# Patient Record
Sex: Female | Born: 1965 | Race: Black or African American | Hispanic: No | Smoking: Former smoker
Health system: Southern US, Community
[De-identification: ages and names within clinical notes are randomized; demographics above are authoritative.]

## PROBLEM LIST (undated history)

## (undated) DIAGNOSIS — J939 Pneumothorax, unspecified: Secondary | ICD-10-CM

## (undated) DIAGNOSIS — E7800 Pure hypercholesterolemia, unspecified: Secondary | ICD-10-CM

## (undated) DIAGNOSIS — D869 Sarcoidosis, unspecified: Secondary | ICD-10-CM

## (undated) DIAGNOSIS — H02823 Cysts of right eye, unspecified eyelid: Secondary | ICD-10-CM

## (undated) DIAGNOSIS — I639 Cerebral infarction, unspecified: Secondary | ICD-10-CM

## (undated) DIAGNOSIS — I10 Essential (primary) hypertension: Secondary | ICD-10-CM

## (undated) HISTORY — DX: Cerebral infarction, unspecified: I63.9

## (undated) HISTORY — PX: KIDNEY STONE SURGERY: SHX686

## (undated) HISTORY — DX: Cysts of right eye, unspecified eyelid: H02.823

## (undated) HISTORY — PX: THYROID SURGERY: SHX805

---

## 2001-10-06 HISTORY — PX: LUNG SURGERY: SHX703

## 2016-04-23 ENCOUNTER — Encounter (HOSPITAL_COMMUNITY): Payer: Self-pay | Admitting: *Deleted

## 2016-04-23 ENCOUNTER — Inpatient Hospital Stay (HOSPITAL_COMMUNITY)
Admission: EM | Admit: 2016-04-23 | Discharge: 2016-04-25 | DRG: 065 | Disposition: A | Discharge status: Rehabilitation Facility | Payer: Medicare Other | Attending: Internal Medicine | Admitting: Internal Medicine

## 2016-04-23 ENCOUNTER — Emergency Department (HOSPITAL_COMMUNITY): Payer: Medicare Other

## 2016-04-23 ENCOUNTER — Inpatient Hospital Stay (HOSPITAL_COMMUNITY): Payer: Medicare Other

## 2016-04-23 DIAGNOSIS — E669 Obesity, unspecified: Secondary | ICD-10-CM | POA: Diagnosis present

## 2016-04-23 DIAGNOSIS — R112 Nausea with vomiting, unspecified: Secondary | ICD-10-CM | POA: Diagnosis not present

## 2016-04-23 DIAGNOSIS — I69351 Hemiplegia and hemiparesis following cerebral infarction affecting right dominant side: Secondary | ICD-10-CM | POA: Diagnosis present

## 2016-04-23 DIAGNOSIS — I639 Cerebral infarction, unspecified: Principal | ICD-10-CM | POA: Diagnosis present

## 2016-04-23 DIAGNOSIS — D869 Sarcoidosis, unspecified: Secondary | ICD-10-CM | POA: Diagnosis present

## 2016-04-23 DIAGNOSIS — Z888 Allergy status to other drugs, medicaments and biological substances status: Secondary | ICD-10-CM | POA: Diagnosis not present

## 2016-04-23 DIAGNOSIS — G8191 Hemiplegia, unspecified affecting right dominant side: Secondary | ICD-10-CM | POA: Diagnosis present

## 2016-04-23 DIAGNOSIS — E7800 Pure hypercholesterolemia, unspecified: Secondary | ICD-10-CM | POA: Diagnosis present

## 2016-04-23 DIAGNOSIS — R471 Dysarthria and anarthria: Secondary | ICD-10-CM | POA: Diagnosis present

## 2016-04-23 DIAGNOSIS — G629 Polyneuropathy, unspecified: Secondary | ICD-10-CM | POA: Diagnosis present

## 2016-04-23 DIAGNOSIS — Z880 Allergy status to penicillin: Secondary | ICD-10-CM | POA: Diagnosis not present

## 2016-04-23 DIAGNOSIS — E785 Hyperlipidemia, unspecified: Secondary | ICD-10-CM | POA: Diagnosis present

## 2016-04-23 DIAGNOSIS — Z823 Family history of stroke: Secondary | ICD-10-CM | POA: Diagnosis not present

## 2016-04-23 DIAGNOSIS — R29703 NIHSS score 3: Secondary | ICD-10-CM | POA: Diagnosis present

## 2016-04-23 DIAGNOSIS — Z79899 Other long term (current) drug therapy: Secondary | ICD-10-CM | POA: Diagnosis not present

## 2016-04-23 DIAGNOSIS — K5900 Constipation, unspecified: Secondary | ICD-10-CM | POA: Diagnosis present

## 2016-04-23 DIAGNOSIS — I10 Essential (primary) hypertension: Secondary | ICD-10-CM | POA: Diagnosis present

## 2016-04-23 DIAGNOSIS — F17210 Nicotine dependence, cigarettes, uncomplicated: Secondary | ICD-10-CM | POA: Diagnosis present

## 2016-04-23 DIAGNOSIS — F329 Major depressive disorder, single episode, unspecified: Secondary | ICD-10-CM | POA: Diagnosis present

## 2016-04-23 DIAGNOSIS — R531 Weakness: Secondary | ICD-10-CM | POA: Insufficient documentation

## 2016-04-23 DIAGNOSIS — Z6834 Body mass index (BMI) 34.0-34.9, adult: Secondary | ICD-10-CM

## 2016-04-23 DIAGNOSIS — M6289 Other specified disorders of muscle: Secondary | ICD-10-CM | POA: Diagnosis not present

## 2016-04-23 HISTORY — DX: Pure hypercholesterolemia, unspecified: E78.00

## 2016-04-23 HISTORY — DX: Pneumothorax, unspecified: J93.9

## 2016-04-23 HISTORY — DX: Essential (primary) hypertension: I10

## 2016-04-23 HISTORY — DX: Sarcoidosis, unspecified: D86.9

## 2016-04-23 LAB — COMPREHENSIVE METABOLIC PANEL
ALT: 18 U/L (ref 14–54)
ANION GAP: 8 (ref 5–15)
AST: 24 U/L (ref 15–41)
Albumin: 4.1 g/dL (ref 3.5–5.0)
Alkaline Phosphatase: 83 U/L (ref 38–126)
BUN: 10 mg/dL (ref 6–20)
CHLORIDE: 105 mmol/L (ref 101–111)
CO2: 26 mmol/L (ref 22–32)
Calcium: 9.8 mg/dL (ref 8.9–10.3)
Creatinine, Ser: 0.73 mg/dL (ref 0.44–1.00)
GFR calc non Af Amer: >60 mL/min (ref >60)
Glucose, Bld: 73 mg/dL (ref 65–99)
Potassium: 3.8 mmol/L (ref 3.5–5.1)
SODIUM: 139 mmol/L (ref 135–145)
Total Bilirubin: 0.6 mg/dL (ref 0.3–1.2)
Total Protein: 8 g/dL (ref 6.5–8.1)

## 2016-04-23 LAB — CBC
HCT: 42.8 % (ref 36.0–46.0)
Hemoglobin: 14.6 g/dL (ref 12.0–15.0)
MCH: 31.3 pg (ref 26.0–34.0)
MCHC: 34.1 g/dL (ref 30.0–36.0)
MCV: 91.8 fL (ref 78.0–100.0)
PLATELETS: 207 10*3/uL (ref 150–400)
RBC: 4.66 MIL/uL (ref 3.87–5.11)
RDW: 13.3 % (ref 11.5–15.5)
WBC: 7 10*3/uL (ref 4.0–10.5)

## 2016-04-23 LAB — DIFFERENTIAL
BASOS PCT: 0 %
Basophils Absolute: 0 10*3/uL (ref 0.0–0.1)
EOS PCT: 2 %
Eosinophils Absolute: 0.1 10*3/uL (ref 0.0–0.7)
Lymphocytes Relative: 35 %
Lymphs Abs: 2.5 10*3/uL (ref 0.7–4.0)
MONO ABS: 0.7 10*3/uL (ref 0.1–1.0)
Monocytes Relative: 10 %
NEUTROS ABS: 3.7 10*3/uL (ref 1.7–7.7)
Neutrophils Relative %: 53 %

## 2016-04-23 LAB — I-STAT CHEM 8, ED
BUN: 10 mg/dL (ref 6–20)
CALCIUM ION: 1.09 mmol/L — AB (ref 1.13–1.30)
CHLORIDE: 105 mmol/L (ref 101–111)
Creatinine, Ser: 0.7 mg/dL (ref 0.44–1.00)
Glucose, Bld: 69 mg/dL (ref 65–99)
HEMATOCRIT: 45 % (ref 36.0–46.0)
Hemoglobin: 15.3 g/dL — ABNORMAL HIGH (ref 12.0–15.0)
POTASSIUM: 3.8 mmol/L (ref 3.5–5.1)
SODIUM: 140 mmol/L (ref 135–145)
TCO2: 23 mmol/L (ref 0–100)

## 2016-04-23 LAB — PROTIME-INR
INR: 1.09 (ref 0.00–1.49)
Prothrombin Time: 14.3 seconds (ref 11.6–15.2)

## 2016-04-23 LAB — I-STAT TROPONIN, ED: Troponin i, poc: 0 ng/mL (ref 0.00–0.08)

## 2016-04-23 LAB — ETHANOL

## 2016-04-23 LAB — APTT: aPTT: 27 seconds (ref 24–37)

## 2016-04-23 MED ORDER — STROKE: EARLY STAGES OF RECOVERY BOOK
Freq: Once | Status: AC
  Administered 2016-04-23: 22:00:00

## 2016-04-23 MED ORDER — DOXEPIN HCL 10 MG/ML PO CONC
3.0000 mg | Freq: Every day | ORAL | Status: DC
  Administered 2016-04-24: 3 mg via ORAL
  Filled 2016-04-23 (×4): qty 0.3

## 2016-04-23 MED ORDER — HYDROCHLOROTHIAZIDE 25 MG PO TABS
12.5000 mg | ORAL_TABLET | Freq: Every morning | ORAL | Status: DC
  Administered 2016-04-24 – 2016-04-25 (×2): 12.5 mg via ORAL
  Filled 2016-04-23 (×2): qty 1

## 2016-04-23 MED ORDER — ASPIRIN 325 MG PO TABS
325.0000 mg | ORAL_TABLET | Freq: Every day | ORAL | Status: DC
  Administered 2016-04-23 – 2016-04-25 (×3): 325 mg via ORAL
  Filled 2016-04-23 (×3): qty 1

## 2016-04-23 MED ORDER — LORAZEPAM 2 MG/ML IJ SOLN
0.5000 mg | Freq: Once | INTRAMUSCULAR | Status: AC
  Administered 2016-04-24: 0.5 mg via INTRAVENOUS
  Filled 2016-04-23: qty 1

## 2016-04-23 MED ORDER — SODIUM CHLORIDE 0.9 % IV SOLN
INTRAVENOUS | Status: AC
  Administered 2016-04-23: 22:00:00 via INTRAVENOUS

## 2016-04-23 MED ORDER — ASPIRIN 325 MG PO TABS
325.0000 mg | ORAL_TABLET | Freq: Once | ORAL | Status: AC
  Administered 2016-04-23: 325 mg via ORAL
  Filled 2016-04-23: qty 1

## 2016-04-23 MED ORDER — GABAPENTIN 300 MG PO CAPS
300.0000 mg | ORAL_CAPSULE | Freq: Every day | ORAL | Status: DC
  Administered 2016-04-23 – 2016-04-24 (×2): 300 mg via ORAL
  Filled 2016-04-23 (×2): qty 1

## 2016-04-23 MED ORDER — SENNOSIDES-DOCUSATE SODIUM 8.6-50 MG PO TABS: 1.0000 | ORAL_TABLET | Freq: Every evening | ORAL | Status: DC | PRN

## 2016-04-23 MED ORDER — ASPIRIN 300 MG RE SUPP: 300.0000 mg | Freq: Every day | RECTAL | Status: DC

## 2016-04-23 MED ORDER — LORAZEPAM 2 MG/ML IJ SOLN
0.5000 mg | Freq: Once | INTRAMUSCULAR | Status: AC
  Administered 2016-04-23: 0.5 mg via INTRAVENOUS
  Filled 2016-04-23: qty 1

## 2016-04-23 MED ORDER — ATORVASTATIN CALCIUM 40 MG PO TABS
40.0000 mg | ORAL_TABLET | Freq: Every day | ORAL | Status: DC
  Administered 2016-04-23 – 2016-04-25 (×3): 40 mg via ORAL
  Filled 2016-04-23 (×3): qty 1

## 2016-04-23 MED ORDER — DULOXETINE HCL 60 MG PO CPEP
60.0000 mg | ORAL_CAPSULE | Freq: Every day | ORAL | Status: DC
  Administered 2016-04-23 – 2016-04-25 (×3): 60 mg via ORAL
  Filled 2016-04-23 (×3): qty 1

## 2016-04-23 NOTE — Consult Note (Addendum)
Neurology Consultation Reason for Consult: Right sided weakness Referring Physician: Alden Server  CC: right sided weakness  History is obtained from:patient  HPI: Janin Kozlowski is a 50 y.o. female with right sided weakness that was present on awakening. Her daughter noticed that her speech was slurred little bit later and therefore EMS was called. A code stroke was called en route given that the slurred speech was reported as starting within the past 8 hours.  She denies any numbness, vertigo, diplopia, nausea or vomiting, or any other symptoms other than weakness and slurred speech.  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  She is not sure of any symptoms of neurosarcoidosis that she has other than headaches.  LKW: 11pm tpa given?: no, out of window    ROS: A 14 point ROS was performed and is negative except as noted in the HPI.   PMH Sarcoid, with pulmonary and neuro gestations Hypertension Hypercholesterolemia  FHx: Grandmother - cva  Social History: +smoker   Exam: Current vital signs: Filed Vitals:   04/23/16 1700 04/23/16 1703  BP: 160/90   Pulse:    Temp:    Resp:  17    Vital signs in last 24 hours:     Physical Exam  Constitutional: Appears well-developed and well-nourished.  Psych: Affect appropriate to situation Eyes: No scleral injection HENT: No OP obstrucion Head: Normocephalic.  Cardiovascular: Normal rate and regular rhythm.  Respiratory: Effort normal and breath sounds normal to anterior ascultation GI: Soft.  No distension. There is no tenderness.  Skin: WDI  Neuro: Mental Status: Patient is awake, alert, oriented to person, place, month, year, and situation. Patient is able to give a clear and coherent history. No signs of aphasia or neglect She does have dysarthria Cranial Nerves: II: Visual Fields are full. Pupils are equal, round, and reactive to light.   III,IV, VI: EOMI  without ptosis or diploplia.  V: Facial sensation is symmetric to temperature VII: Facial movement is symmetric.  VIII: hearing is intact to voice X: Uvula elevates symmetrically XI: Shoulder shrug is symmetric. XII: tongue is midline without atrophy or fasciculations.  Motor: Tone is normal. Bulk is normal. 5/5 strength was present on the left side, on the right she has mild weakness of both the arm and leg Sensory: Sensation is symmetric to light touch and temperature in the arms and legs. Cerebellar: FNF and HKS are intact on the left, consistent with weakness on the right  I have reviewed labs in epic and the results pertinent to this consultation are: Chem 8-unremarkable  I have reviewed the images obtained: CT head-unremarkable  Impression: 50 year old female with new onset right-sided weakness that was present on awakening. I strongly suspect that she has had a small ischemic infarct and has risk factors including hypertension, hyperlipidemia, smoking. Neurosarcoidosis symptoms typically are more insidious(unless caused by infarction) and I suspect that this is rather more likely to be ischemic in nature.  Recommendations: 1. HgbA1c, fasting lipid panel 2. MRI of the brain without contrast 3. Frequent neuro checks 4. Echocardiogram 5. CT angiogram of the head and neck. 6. Prophylactic therapy-Antiplatelet med: Aspirin - dose  PO or  PR 7. Risk factor modification 8. Telemetry monitoring 9. PT consult, OT consult, Speech consult 10. please page stroke NP  Or  PA  Or MD  M-F from 8am -4 pm starting 7/20 as this patient will be followed by the stroke team at this  point.   You can look them up on [email or web address]      Ritta SlotMcNeill Yadhira Mckneely, MD [facility] [phone]  If 7pm- 7am, please page neurology on call as listed in AMION.

## 2016-04-23 NOTE — ED Notes (Signed)
Pt arrives via EMS from physician office. Pt states she went to bed at 2300 last night. States that she awoke with bilateral leg weakness and right arm weakness. Noticed slurred speech at 1100 this morning. Pt has hx NTG, high cholesterol and neuro sarcoidosis.

## 2016-04-23 NOTE — H&P (Signed)
History and Physical    Sharon Nyhannez Guillet HYQ:[id number] DOB: 05/22/1966 DOA: 04/23/2016  Referring MD/NP/PA: Dr. Marlane MingleJakubovitz  PCP: No primary care provider on file. MD in [place]   Patient coming from: home  Chief Complaint: slurred speech   HPI: Sharon Roberson is a 50 y.o. female with medical history significant for hypertension, neuropathy, dyslipidemia who [facility] with new onset right-sided weakness when she woke up this morning. Patient reports she called her daughter who noticed that her mom has slurred speech and she advised her to go to ED for evaluation. Patient reports no numbness or tingling sensation. No lightheadedness or loss of consciousness. No reports of right sided paralysis. No reports of chest pain or palpitations. No fevers or chills. No nausea or vomiting or abdominal pain.  ED Course: Pt was hemodynamically stable in ED. Stroke code called but cancelled subsequently. Pt outside of the treatment window. Her blood work was essentially unremarkable. CT head showed no acute intracranial findings. MRI pending at this time.  Review of Systems:  Constitutional: Negative for fever, chills, diaphoresis, activity change, appetite change and fatigue.  HENT: Negative for ear pain, nosebleeds, congestion, facial swelling, rhinorrhea, neck pain, neck stiffness and ear discharge.   Eyes: Negative for pain, discharge, redness, itching and visual disturbance.  Respiratory: Negative for cough, choking, chest tightness, shortness of breath, wheezing and stridor.   Cardiovascular: Negative for chest pain, palpitations and leg swelling.  Gastrointestinal: Negative for abdominal distention.  Genitourinary: Negative for dysuria, urgency, frequency, hematuria, flank pain, decreased urine volume, difficulty urinating and dyspareunia.  Musculoskeletal: Negative for back pain, joint swelling, arthralgias and gait problem.  Neurological: Per HPI Hematological: Negative for adenopathy.  Does not bruise/bleed easily.  Psychiatric/Behavioral: Negative for hallucinations, behavioral problems, confusion, dysphoric mood, decreased concentration and agitation.   Past Medical History  Diagnosis Date  . Hypertension   . Hypercholesteremia   . Sarcoidosis (HCC)   . Pneumothorax     History reviewed. No pertinent past surgical history.  Social history:  reports that she has been smoking Cigarettes.  She has been smoking about 0.50 packs per day. She does not have any smokeless tobacco history on file. She reports that she drinks alcohol. She reports that she does not use illicit drugs.  Ambulation independent, without cane or wheelchair at baseline   Allergies  Allergen Reactions  . Penicillins Anaphylaxis    Has patient had a PCN reaction causing immediate rash, facial/tongue/throat swelling, SOB or lightheadedness with hypotension: Yes Has patient had a PCN reaction causing severe rash involving mucus membranes or skin necrosis: No Has patient had a PCN reaction that required hospitalization Yes Has patient had a PCN reaction occurring within the last 10 years: Yes If all of the above answers are "NO", then may proceed with Cephalosporin use.   . Sulfamethoxazole Anaphylaxis    Family history of hypertension in mother.  Prior to Admission medications   Not on File    Physical Exam: Filed Vitals:   04/23/16 1659 04/23/16 1700 04/23/16 1703  BP: 155/89 160/90   Pulse: 67    Temp: 97.8 F (36.6 C)    TempSrc: Oral    Resp: 18  17  Height: 4\' 10"  (1.473 m)    Weight: 75.8 kg (167 lb 1.7 oz)    SpO2: 98%      Constitutional: NAD, calm, comfortable Filed Vitals:   04/23/16 1659 04/23/16 1700 04/23/16 1703  BP: 155/89 160/90   Pulse: 67  Temp: 97.8 F (36.6 C)    TempSrc: Oral    Resp: 18  17  Height:  (1.473 m)    Weight: 75.8 kg (167 lb 1.7 oz)    SpO2: 98%     Eyes: PERRL, lids and conjunctivae normal ENMT: Mucous membranes are moist.  Posterior pharynx clear of any exudate or lesions.Normal dentition.  Neck: normal, supple, no masses, no thyromegaly Respiratory: clear to auscultation bilaterally, no wheezing, no crackles. Normal respiratory effort. No accessory muscle use.  Cardiovascular: Regular rate and rhythm, no murmurs / rubs / gallops. No extremity edema. 2+ pedal pulses. No carotid bruits.  Abdomen: no tenderness, no masses palpated. No hepatosplenomegaly. Bowel sounds positive.  Musculoskeletal: no clubbing / cyanosis. No joint deformity upper and lower extremities. Good ROM, no contractures. Normal muscle tone.  Skin: no rashes, lesions, ulcers. No induration Psychiatric: Normal judgment and insight. Alert and oriented x 3. Normal mood.   Neurologic:  General: Mental Status: Alert, oriented, thought content appropriate. Mild dysarthria without evidence of aphasia. Able to follow 3 step commands without difficulty. Cranial Nerves: II: Discs flat bilaterally; Visual fields grossly normal, pupils equal, round, reactive to light and accommodation III,IV, VI: ptosis not present, extra-ocular motions intact bilaterally V,VII: smile symmetric, facial light touch sensation normal bilaterally VIII: hearing normal bilaterally IX,X: uvula rises symmetrically XI: bilateral shoulder shrug XII: midline tongue extension without atrophy or fasciculations  Motor: Right :Upper extremity 5/5Left: Upper extremity 5/5 Lower extremity 5/5Lower extremity 5/5 Tone and bulk:normal tone throughout; no atrophy noted Sensory: Pinprick and light touch intact throughout, bilaterally Deep Tendon Reflexes:  Right: Upper Extremity Left: Upper extremity   biceps (C-5 to C-6) 2/4 biceps (C-5 to C-6) 2/4  Lower Extremity Lower Extremity  quadriceps (L-2 to L-4) 2/4  quadriceps (L-2 to L-4) 2/4 Achilles (S1) 2/4Achilles (S1) 2/4  Plantars: Right: downgoingLeft: downgoing Cerebellar: normal finger-to-nose, normal heel-to-shin test   Labs on Admission: I have personally reviewed following labs and imaging studies  CBC:  Recent Labs Lab 04/23/16 1638 04/23/16 1650  WBC 7.0  --   NEUTROABS 3.7  --   HGB 14.6 15.3*  HCT 42.8 45.0  MCV 91.8  --   PLT 207  --    Basic Metabolic Panel:  Recent Labs Lab 04/23/16 1638 04/23/16 1650  NA 139 140  K 3.8 3.8  CL 105 105  CO2 26  --   GLUCOSE 73 69  BUN 10 10  CREATININE 0.73 0.70  CALCIUM 9.8  --    GFR: Estimated Creatinine Clearance: 72.9 mL/min (by C-G formula based on Cr of 0.7). Liver Function Tests:  Recent Labs Lab 04/23/16 1638  AST 24  ALT 18  ALKPHOS 83  BILITOT 0.6  PROT 8.0  ALBUMIN 4.1   No results for input(s): LIPASE, AMYLASE in the last 168 hours. No results for input(s): AMMONIA in the last 168 hours. Coagulation Profile:  Recent Labs Lab 04/23/16 1638  INR 1.09   Cardiac Enzymes: No results for input(s): CKTOTAL, CKMB, CKMBINDEX, TROPONINI in the last 168 hours. BNP (last 3 results) No results for input(s): PROBNP in the last 8760 hours. HbA1C: No results for input(s): HGBA1C in the last 72 hours. CBG: No results for input(s): GLUCAP in the last 168 hours. Lipid Profile: No results for input(s): CHOL, HDL, LDLCALC, TRIG, CHOLHDL, LDLDIRECT in the last 72 hours. Thyroid Function Tests: No results for input(s): TSH, T4TOTAL, FREET4, T3FREE, THYROIDAB in the last 72 hours. Anemia Panel: No results  for input(s): VITAMINB12, FOLATE, FERRITIN, TIBC, IRON, RETICCTPCT in the last 72 hours. Urine analysis: No results found for: COLORURINE, APPEARANCEUR, LABSPEC, PHURINE, GLUCOSEU, HGBUR, BILIRUBINUR, KETONESUR, PROTEINUR, UROBILINOGEN, NITRITE, LEUKOCYTESUR Sepsis  Labs: @LABRCNTIP (procalcitonin:4,lacticidven:4) )No results found for this or any previous visit (from the past 240 hour(s)).   Radiological Exams on Admission: Ct Head Wo Contrast 04/23/2016   Normal CT evaluation of the brain. Critical Value/emergent results were called by telephone at the time of interpretation on 04/23/2016 at 4:55 pm to April, working with Dr. Amada Jupiter, who verbally acknowledged these results. Electronically Signed   By: Kennith Center M.D.   On: 04/23/2016 16:56    EKG: Independently reviewed. Sinus rhythm.  Assessment/Plan  Principal Problem:   Acute right-sided weakness / CVA (cerebral infarction), unspecified mechanism  - Stroke work up initiated:  - Aspirin daily - MRI brain / MRA brain - pending  - CT head - no acute intracranial findings  - 2D ECHO - pending  - Carotid doppler - pending  - HgbA1c, Lipid panel - pending. LDL goal < 100. Patient on zocor  80 mg a day - Diet: passed swallow screen - Therapy: PT/OT - eval pending  - Appreciate neurology following  Active Problems:   Benign essential HTN - Resume Hctz    Neuropathy (HCC) - Resume gabapentin    Dyslipidemia - Resume statin therapy       DVT prophylaxis: SCD's bilaterally Code Status: full code Family Communication: no family at the bedside Disposition Plan: admission to telemetry  Consults called: stroke team Admission status: inpatient    Manson Passey MD [facility] Pager [phone]  If 7PM-7AM, please contact night-coverage [email or web address] Password [email or web address]  04/23/2016, 6:20 PM

## 2016-04-23 NOTE — ED Provider Notes (Signed)
CSN: [id number]     Arrival date & time 04/23/16  1633 History   First MD Initiated Contact with Patient 04/23/16 1637     No chief complaint on file. Chief complaint right arm and right leg weakness slurred speech  An emergency department physician performed an initial assessment on this suspected stroke patient at 3. (Consider location/radiation/quality/duration/timing/severity/associated sxs/prior Treatment) HPI Patient awakened at 8:30 AM today with right arm and right leg weakness. She was told by her daughter at 3:30 PM today that her speech was slurred. Patient did not recognize that she had slurred speech. No treatment prior to coming here. She states weakness is improved since onset. She was last normal at 11 PM yesterday, when she went to bed. No other associated symptoms. Nothing makes symptoms better or worse. No other associated symptoms. Weakness improved spontaneously Past Medical History  Diagnosis Date  . Hypertension   . Hypercholesteremia   . Sarcoidosis (HCC)   . Pneumothorax    History reviewed. No pertinent past surgical history. No family history on file. Social History  Substance Use Topics  . Smoking status: Current Every Day Smoker -- 0.50 packs/day    Types: Cigarettes  . Smokeless tobacco: None  . Alcohol Use: Yes     Comment: occasional  Denies drug use OB History    No data available     Review of Systems  HENT: Negative.   Respiratory: Negative.   Cardiovascular: Negative.   Gastrointestinal: Negative.   Musculoskeletal: Negative.   Skin: Negative.   Neurological: Positive for speech difficulty and weakness.  Psychiatric/Behavioral: Negative.   All other systems reviewed and are negative.     Allergies  Penicillins and Sulfamethoxazole  Home Medications   Prior to Admission medications   Not on File   BP 160/90 mmHg  Pulse 67  Temp(Src) 97.8 F (36.6 C) (Oral)  Resp 17  Ht 4\' 10"  (1.473 m)  Wt 167 lb 1.7 oz (75.8 kg)  BMI  34.94 kg/m2  SpO2 98% Physical Exam  Constitutional: She is oriented to person, place, and time. She appears well-developed and well-nourished. No distress.  HENT:  Head: Normocephalic and atraumatic.  No facial asymmetry  Eyes: Conjunctivae are normal. Pupils are equal, round, and reactive to light.  Neck: Neck supple. No tracheal deviation present. No thyromegaly present.  Cardiovascular: Normal rate and regular rhythm.   No murmur heard. Pulmonary/Chest: Effort normal and breath sounds normal.  Abdominal: Soft. Bowel sounds are normal. She exhibits no distension. There is no tenderness.  Musculoskeletal: Normal range of motion. She exhibits no edema or tenderness.  Neurological: She is alert and oriented to person, place, and time. No cranial nerve deficit. Coordination normal.  Motor strength 5 over 5 overall. Finger to nose normal. DTRs symmetric bilaterally at knee jerk ankle jerk and biceps toes downward going bilaterally. Speech is slightly slurred  Skin: Skin is warm and dry. No rash noted.  Psychiatric: She has a normal mood and affect.  Nursing note and vitals reviewed.   ED Course  Procedures (including critical care time) Labs Review Labs Reviewed  I-STAT CHEM 8, ED - Abnormal; Notable for the following:    Calcium, Ion 1.09 (*)    Hemoglobin 15.3 (*)    All other components within normal limits  PROTIME-INR  APTT  CBC  DIFFERENTIAL  ETHANOL  COMPREHENSIVE METABOLIC PANEL  URINE RAPID DRUG SCREEN, HOSP PERFORMED  URINALYSIS, ROUTINE W REFLEX MICROSCOPIC (NOT AT [facility])  Rosezena Sensor, ED  Imaging Review Ct Head Wo Contrast  04/23/2016  CLINICAL DATA:  Code stroke. EXAM: CT HEAD WITHOUT CONTRAST TECHNIQUE: Contiguous axial images were obtained from the base of the skull through the vertex without intravenous contrast. COMPARISON:  None. FINDINGS: There is no evidence for acute hemorrhage, hydrocephalus, mass lesion, or abnormal extra-axial fluid collection. No  definite CT evidence for acute infarction. The visualized paranasal sinuses and mastoid air cells are clear. IMPRESSION: Normal CT evaluation of the brain. Critical Value/emergent results were called by telephone at the time of interpretation on 04/23/2016 at 4:55 pm to April, working with Dr. Amada Jupiter, who verbally acknowledged these results. Electronically Signed   By: Kennith Center M.D.   On: 04/23/2016 16:56   I have personally reviewed and evaluated these images and lab results as part of my medical decision-making.   EKG Interpretation   Date/Time:  Wednesday April 23 2016 17:05:37 EDT Ventricular Rate:  71 PR Interval:    QRS Duration: 80 QT Interval:  415 QTC Calculation: 451 R Axis:   59 Text Interpretation:  Sinus rhythm No old tracing to compare Confirmed by  Ethelda Chick  MD, Sharada Albornoz [phone]) on 04/23/2016 5:20:51 PM     NIH stroke scale calculated at 3 by neurologist Dr. Amada Jupiter. He noticed slight right-sided weakness and slurred speech. Patient passed stroke swallow screen. Aspirin ordered orally. Results for orders placed or performed during the hospital encounter of 04/23/16  Ethanol  Result Value Ref Range   Alcohol, Ethyl (B) <5 <5 mg/dL  Protime-INR  Result Value Ref Range   Prothrombin Time 14.3 11.6 - 15.2 seconds   INR 1.09 0.00 - 1.49  APTT  Result Value Ref Range   aPTT 27 24 - 37 seconds  CBC  Result Value Ref Range   WBC 7.0 4.0 - 10.5 K/uL   RBC 4.66 3.87 - 5.11 MIL/uL   Hemoglobin 14.6 12.0 - 15.0 g/dL   HCT 19.1 47.8 - 29.5 %   MCV 91.8 78.0 - 100.0 fL   MCH 31.3 26.0 - 34.0 pg   MCHC 34.1 30.0 - 36.0 g/dL   RDW 62.1 30.8 - 65.7 %   Platelets 207 150 - 400 K/uL  Differential  Result Value Ref Range   Neutrophils Relative % 53 %   Neutro Abs 3.7 1.7 - 7.7 K/uL   Lymphocytes Relative 35 %   Lymphs Abs 2.5 0.7 - 4.0 K/uL   Monocytes Relative 10 %   Monocytes Absolute 0.7 0.1 - 1.0 K/uL   Eosinophils Relative 2 %   Eosinophils Absolute 0.1 0.0 -  0.7 K/uL   Basophils Relative 0 %   Basophils Absolute 0.0 0.0 - 0.1 K/uL  Comprehensive metabolic panel  Result Value Ref Range   Sodium 139 135 - 145 mmol/L   Potassium 3.8 3.5 - 5.1 mmol/L   Chloride 105 101 - 111 mmol/L   CO2 26 22 - 32 mmol/L   Glucose, Bld 73 65 - 99 mg/dL   BUN 10 6 - 20 mg/dL   Creatinine, Ser 8.46 0.44 - 1.00 mg/dL   Calcium 9.8 8.9 - 96.2 mg/dL   Total Protein 8.0 6.5 - 8.1 g/dL   Albumin 4.1 3.5 - 5.0 g/dL   AST 24 15 - 41 U/L   ALT 18 14 - 54 U/L   Alkaline Phosphatase 83 38 - 126 U/L   Total Bilirubin 0.6 0.3 - 1.2 mg/dL   GFR calc non Af Amer >60 >60 mL/min   GFR calc  Af Amer >60 >60 mL/min   Anion gap 8 5 - 15  I-Stat Chem 8, ED  ([facility])  Result Value Ref Range   Sodium 140 135 - 145 mmol/L   Potassium 3.8 3.5 - 5.1 mmol/L   Chloride 105 101 - 111 mmol/L   BUN 10 6 - 20 mg/dL   Creatinine, Ser 1.610.70 0.44 - 1.00 mg/dL   Glucose, Bld 69 65 - 99 mg/dL   Calcium, Ion 0.961.09 (L) 1.13 - 1.30 mmol/L   TCO2 23 0 - 100 mmol/L   Hemoglobin 15.3 (H) 12.0 - 15.0 g/dL   HCT 04.545.0 40.936.0 - 81.146.0 %  I-stat troponin, ED ([facility] at [facility])  Result Value Ref Range   Troponin i, poc 0.00 0.00 - 0.08 ng/mL   Comment 3           Ct Head Wo Contrast  04/23/2016  CLINICAL DATA:  Code stroke. EXAM: CT HEAD WITHOUT CONTRAST TECHNIQUE: Contiguous axial images were obtained from the base of the skull through the vertex without intravenous contrast. COMPARISON:  None. FINDINGS: There is no evidence for acute hemorrhage, hydrocephalus, mass lesion, or abnormal extra-axial fluid collection. No definite CT evidence for acute infarction. The visualized paranasal sinuses and mastoid air cells are clear. IMPRESSION: Normal CT evaluation of the brain. Critical Value/emergent results were called by telephone at the time of interpretation on 04/23/2016 at 4:55 pm to April, working with Dr. Amada JupiterKirkpatrick, who verbally acknowledged these results. Electronically Signed   By: Kennith CenterEric   Mansell M.D.   On: 04/23/2016 16:56    MDM  Code stroke was called in the field which was canceled upon her arrival here by me in conjunction with Dr. Amada JupiterKirkpatrick Final diagnoses:  Right sided weakness   Dr. Elisabeth PigeoneVine consulted. Plan admission to telemetry for further stroke workup. Dx #1 acute right arm and right leg weakness #2 dysarthria #3 tobacco abuse #4 elevated blood pressure      Doug SouSam Heela Heishman, MD 04/23/16 1750

## 2016-04-24 ENCOUNTER — Inpatient Hospital Stay (HOSPITAL_COMMUNITY): Payer: Medicare Other

## 2016-04-24 DIAGNOSIS — M6289 Other specified disorders of muscle: Secondary | ICD-10-CM

## 2016-04-24 DIAGNOSIS — E785 Hyperlipidemia, unspecified: Secondary | ICD-10-CM

## 2016-04-24 DIAGNOSIS — I6789 Other cerebrovascular disease: Secondary | ICD-10-CM

## 2016-04-24 DIAGNOSIS — I639 Cerebral infarction, unspecified: Principal | ICD-10-CM

## 2016-04-24 DIAGNOSIS — I10 Essential (primary) hypertension: Secondary | ICD-10-CM

## 2016-04-24 DIAGNOSIS — F17200 Nicotine dependence, unspecified, uncomplicated: Secondary | ICD-10-CM

## 2016-04-24 DIAGNOSIS — G629 Polyneuropathy, unspecified: Secondary | ICD-10-CM

## 2016-04-24 LAB — LIPID PANEL
Cholesterol: 251 mg/dL — ABNORMAL HIGH (ref 0–200)
HDL: 54 mg/dL (ref >40)
LDL CALC: 147 mg/dL — AB (ref 0–99)
Total CHOL/HDL Ratio: 4.6 RATIO
Triglycerides: 250 mg/dL — ABNORMAL HIGH (ref <150)
VLDL: 50 mg/dL — ABNORMAL HIGH (ref 0–40)

## 2016-04-24 LAB — URINALYSIS, ROUTINE W REFLEX MICROSCOPIC
Bilirubin Urine: NEGATIVE
Glucose, UA: NEGATIVE mg/dL
Hgb urine dipstick: NEGATIVE
Ketones, ur: NEGATIVE mg/dL
NITRITE: NEGATIVE
Protein, ur: NEGATIVE mg/dL
SPECIFIC GRAVITY, URINE: 1.016 (ref 1.005–1.030)
pH: 6.5 (ref 5.0–8.0)

## 2016-04-24 LAB — ECHOCARDIOGRAM COMPLETE
HEIGHTINCHES: 58 in
Weight: 2673.74 oz

## 2016-04-24 LAB — URINE MICROSCOPIC-ADD ON: RBC / HPF: NONE SEEN RBC/hpf (ref 0–5)

## 2016-04-24 LAB — RAPID URINE DRUG SCREEN, HOSP PERFORMED
Amphetamines: NOT DETECTED
Barbiturates: NOT DETECTED
Benzodiazepines: NOT DETECTED
Cocaine: NOT DETECTED
Opiates: NOT DETECTED
Tetrahydrocannabinol: POSITIVE — AB

## 2016-04-24 MED ORDER — GADOBENATE DIMEGLUMINE 529 MG/ML IV SOLN
15.0000 mL | Freq: Once | INTRAVENOUS | Status: AC
  Administered 2016-04-24: 15 mL via INTRAVENOUS

## 2016-04-24 MED ORDER — NICOTINE 14 MG/24HR TD PT24
14.0000 mg | MEDICATED_PATCH | Freq: Every day | TRANSDERMAL | Status: DC
  Administered 2016-04-24 – 2016-04-25 (×2): 14 mg via TRANSDERMAL
  Filled 2016-04-24: qty 1

## 2016-04-24 MED ORDER — FAMOTIDINE 20 MG PO TABS
40.0000 mg | ORAL_TABLET | Freq: Every day | ORAL | Status: DC
  Administered 2016-04-24 – 2016-04-25 (×2): 40 mg via ORAL
  Filled 2016-04-24 (×2): qty 2

## 2016-04-24 MED ORDER — ACETAMINOPHEN 325 MG PO TABS
650.0000 mg | ORAL_TABLET | Freq: Four times a day (QID) | ORAL | Status: DC | PRN
  Administered 2016-04-24 – 2016-04-25 (×2): 650 mg via ORAL
  Filled 2016-04-24 (×2): qty 2

## 2016-04-24 NOTE — Progress Notes (Signed)
Pt went down for MRI after 0.5mg  of Ativan given at 2138, but MRI personnel called and said pt is still very apprehensive and yelling, so pt was brought back to room, NP Maren ReamerKaren Kirby paged who wrote an order to give an extra 0.5mg  of Ativan, same related back to pt and MRI staff, said will call when they are ready for her, pt reassured, will however continue to monitor. Obasogie-Asidi, Nika Yazzie Efe

## 2016-04-24 NOTE — Progress Notes (Signed)
STROKE TEAM PROGRESS NOTE   HPI: Sharon Roberson is a 50 y.o. female with right sided weakness that was present on awakening. Her daughter noticed that her speech was slurred little bit later and therefore EMS was called. A code stroke was called en route given that the slurred speech was reported as starting within the past 8 hours.  She denies any numbness, vertigo, diplopia, nausea or vomiting, or any other symptoms other than weakness and slurred speech.  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  She is not sure of any symptoms of neurosarcoidosis that she has other than headaches.  LKW: 11pm tpa given?: no, out of window  SUBJECTIVE (INTERVAL HISTORY) Her daughter is at the bedside.  Overall she feels her condition is stable. She had right UE and LE weakness and dysarthria. Admitted smoking 1 PPD. UDS showed THC positive. Has Hx of HTN and HLD on medications including zocor 80mg .    OBJECTIVE Temp:  [98.3 F (36.8 C)-99.2 F (37.3 C)] 98.8 F (37.1 C) (07/20 1440) Pulse Rate:  [68-89] 74 (07/20 1440) Cardiac Rhythm:  [-] Normal sinus rhythm (07/20 0900) Resp:  [16-20] 20 (07/20 1440) BP: (118-163)/(63-99) 118/65 mmHg (07/20 1440) SpO2:  [98 %-100 %] 100 % (07/20 1440)  No results for input(s): GLUCAP in the last 168 hours.  Recent Labs Lab 04/23/16 1638 04/23/16 1650  NA 139 140  K 3.8 3.8  CL 105 105  CO2 26  --   GLUCOSE 73 69  BUN 10 10  CREATININE 0.73 0.70  CALCIUM 9.8  --     Recent Labs Lab 04/23/16 1638  AST 24  ALT 18  ALKPHOS 83  BILITOT 0.6  PROT 8.0  ALBUMIN 4.1    Recent Labs Lab 04/23/16 1638 04/23/16 1650  WBC 7.0  --   NEUTROABS 3.7  --   HGB 14.6 15.3*  HCT 42.8 45.0  MCV 91.8  --   PLT 207  --    No results for input(s): CKTOTAL, CKMB, CKMBINDEX, TROPONINI in the last 168 hours.  Recent Labs  04/23/16 1638  LABPROT 14.3  INR 1.09    Recent Labs  04/24/16 0423  COLORURINE  YELLOW  LABSPEC 1.016  PHURINE 6.5  GLUCOSEU NEGATIVE  HGBUR NEGATIVE  BILIRUBINUR NEGATIVE  KETONESUR NEGATIVE  PROTEINUR NEGATIVE  NITRITE NEGATIVE  LEUKOCYTESUR TRACE*       Component Value Date/Time   CHOL 251* 04/24/2016 0228   TRIG 250* 04/24/2016 0228   HDL 54 04/24/2016 0228   CHOLHDL 4.6 04/24/2016 0228   VLDL 50* 04/24/2016 0228   LDLCALC 147* 04/24/2016 0228   No results found for: HGBA1C    Component Value Date/Time   LABOPIA NONE DETECTED 04/24/2016 0423   COCAINSCRNUR NONE DETECTED 04/24/2016 0423   LABBENZ NONE DETECTED 04/24/2016 0423   AMPHETMU NONE DETECTED 04/24/2016 0423   THCU POSITIVE* 04/24/2016 0423   LABBARB NONE DETECTED 04/24/2016 0423     Recent Labs Lab 04/23/16 1638  ETH <5    I have personally reviewed the radiological images below and agree with the radiology interpretations.  Ct Head Wo Contrast  04/23/2016  IMPRESSION: Normal CT evaluation of the brain.   Mri and Mra Brain W Wo Contrast  04/24/2016   IMPRESSION: 1. 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 2. Moderately advanced cerebral white matter disease for age, nonspecific. Considerations include chronic small vessel ischemia, neurosarcoidosis, other  vasculitis, and demyelination. No evidence of dural or leptomeningeal sarcoid involvement. 3. No evidence of major branch occlusion or significant proximal stenosis.  Carotid Doppler  1-39% internal carotid artery stenosis bilaterally. Vertebral arteries are patent with antegrade flow.   2D Echocardiogram  - Left ventricle: The cavity size was normal. Wall thickness was  increased in a pattern of mild LVH. Systolic function was normal.  The estimated ejection fraction was in the range of 60% to 65%.  Wall motion was normal; there were no regional wall motion  abnormalities. Doppler parameters are consistent with abnormal  left ventricular relaxation (grade 1 diastolic dysfunction).  Doppler parameters are  consistent with high ventricular filling  pressure. Impressions: - Normal LV systolic function; mild LVH; grade 1 diastolic  dysfunction with elevated LV filling pressure; trace TR.   PHYSICAL EXAM  Temp:  [98.3 F (36.8 C)-99.2 F (37.3 C)] 98.8 F (37.1 C) (07/20 1440) Pulse Rate:  [68-89] 74 (07/20 1440) Resp:  [16-20] 20 (07/20 1440) BP: (118-163)/(63-99) 118/65 mmHg (07/20 1440) SpO2:  [98 %-100 %] 100 % (07/20 1440)  General - Well nourished, well developed, in no apparent distress.  Ophthalmologic - Sharp disc margins OU.  Cardiovascular - Regular rate and rhythm with no murmur.  Neck - supple, no carotid bruits  Mental Status -  Level of arousal and orientation to time, place, and person were intact. Language including expression, naming, repetition, comprehension was assessed and found intact.  Cranial Nerves II - XII - II - Visual field intact OU. III, IV, VI - Extraocular movements intact. V - Facial sensation intact bilaterally. VII - Facial movement intact bilaterally. VIII - Hearing & vestibular intact bilaterally. X - Palate elevates symmetrically, mild dysarthria. XI - Chin turning & shoulder shrug intact bilaterally. XII - Tongue protrusion intact.  Motor Strength - The patient's strength was normal in LUE and LLE, but 4/5 RUE and RLE and pronator drift was present on the right.  Bulk was normal and fasciculations were absent.   Motor Tone - Muscle tone was assessed at the neck and appendages and was normal.  Reflexes - The patient's reflexes were symmetrical in all extremities and she had no pathological reflexes.  Sensory - Light touch, temperature/pinprick were assessed and were symmetrical.    Coordination - The patient had normal movements in the hands with no ataxia or dysmetria.  Tremor was absent.  Gait and Station - deferred to PT in room.   ASSESSMENT/PLAN Ms. Sharon Roberson is a 50 y.o. female with history of HTN and HLD, sarcoidosis and  neurosarcoidosis as well as smoker admitted for right UE and LE weakness. Symptoms stable.    Stroke:  left pontine infarct.  left cingulate gyrus linear DWI changes suspicious for infarct but atypical. Etiology still most likely small vessel disease source given pontine location and multiple stroke risk factors including HTN, HLD, and smoker.  MRI  left pontine infarct.  left cingulate gyrus linear DWI changes suspicious for infarct but atypical.   MRA  unremarkable  Carotid Doppler  unremarkable  2D Echo  EF 60-65%  Due to atypical linear DWI changes at left cingulate gyrus, will recommend 30 day cardiac event monitoring as outpt. Please arrange on discharge.  LDL 147  HgbA1c pending  SCDs for VTE prophylaxis  Diet Heart Room service appropriate?: Yes; Fluid consistency:: Thin   No antithrombotic prior to admission, now on aspirin 325 mg daily  Patient counseled to be compliant with her antithrombotic medications  Ongoing  aggressive stroke risk factor management  Therapy recommendations:  CIR  Disposition:  pending  Hypertension  Home meds:   HCTZ Permissive hypertension (OK if <220/120) for 24-48 hours post stroke and then gradually normalized within 5-7 days. Currently on none BP meds  Stable  Patient counseled to be compliant with her blood pressure medications  Hyperlipidemia  Home meds:  zocor 80   Currently on lipitor 40  LDL 147, goal < 70  Continue statin at discharge  Tobacco abuse  Current smoker  Smoking cessation counseling provided  Nicotine patch provided  Pt is willing to quit  Other Stroke Risk Factors  ETOH use  Obesity, Body mass index is 34.94 kg/(m^2).   Other Active Problems  THC abuse - UDS positive  High TG  Other Pertinent History    Hospital day # 1  Neurology will sign off. Please call with questions. Pt [facility] 2 months. Thanks for the consult.   Marvel PlanJindong Christean Silvestri, MD  PhD Stroke Neurology 04/24/2016 6:27 PM    To contact Stroke Continuity provider, please refer to [email or web address]. After hours, contact General Neurology

## 2016-04-24 NOTE — Progress Notes (Signed)
Patient ID: Sharon Roberson, female   DOB: May 11, 1966, 50 y.o.   MRN: [id number]  PROGRESS NOTE    Sharon Roberson  NWG:[id number] DOB: 07/05/1966 DOA: 04/23/2016  PCP: No primary care provider on file.   Brief Narrative:  50 y.o. female with medical history significant for hypertension, neuropathy, dyslipidemia who presented to [facility] with new onset right-sided weakness when she woke up this morning. Patient reported she called her daughter who noticed that her mom has slurred speech and she advised her to go to ED for evaluation.  Pt was hemodynamically stable in ED. Stroke code called but cancelled subsequently. Pt outside of the treatment window. Her blood work was essentially unremarkable. CT head showed no acute intracranial findings. MRI pending at this time.  Assessment & Plan:   Principal Problem:  Acute right-sided weakness / CVA (cerebral infarction), pons and left cingulate gyrus - Stroke work up initiated:  - Aspirin daily - MRI brain / MRA brain - small acute infarcts in the pons and left cingulate gyrus. Moderately advanced cerebral white matter disease for age, nonspecific. Considerations include chronic small vessel ischemia, neurosarcoidosis, other vasculitis, and demyelination. No evidence of dural or leptomeningeal sarcoid involvement. No evidence of major branch occlusion or significant proximal stenosis.  - CT head - no acute intracranial findings  - 2D ECHO - EF 60%, grade 1 diastolic dysfunction   - Carotid doppler - pending  - HgbA1c, Lipid panel - 147. LDL goal < 100. Patient on zocor 80 mg a day - UDS positive for THC - Diet: passed swallow screen - Therapy: PT/OT - CIR recommended, awaiting their input  - Appreciate neurology following  Active Problems:  Benign essential HTN - Continue Hctz   Neuropathy (HCC) - Continue gabapentin   Dyslipidemia - Continue statin therapy     DVT prophylaxis: SCD's bilaterally  Code Status: full code  Family  Communication: daughter at the bedside this am Disposition Plan: CIR depending on CIR evaluation and recommendations    Consultants:   Neurology  Procedures:   ECHO 04/24/2016 - EF 60%, grade 1 diastolic dysfunction   Antimicrobials:   None    Subjective: No overnight events.  Objective: Filed Vitals:   04/24/16 0700 04/24/16 0900 04/24/16 1421 04/24/16 1440  BP: 144/88 140/80 149/99 118/65  Pulse: 89 88 83 74  Temp:  98.6 F (37 C) 99.2 F (37.3 C) 98.8 F (37.1 C)  TempSrc:   Oral Oral  Resp: 18 16 19 20   Height:      Weight:      SpO2: 100% 100% 100% 100%   No intake or output data in the 24 hours ending 04/24/16 1526 Filed Weights   04/23/16 1659  Weight: 75.8 kg (167 lb 1.7 oz)    Examination:  General exam: Appears calm and comfortable  Respiratory system: Clear to auscultation. Respiratory effort normal. Cardiovascular system: S1 & S2 heard, RRR. No JVD, murmurs, rubs, gallops or clicks. No pedal edema. Gastrointestinal system: Abdomen is nondistended, soft and nontender. No organomegaly or masses felt. Normal bowel sounds heard. Central nervous system: Alert and oriented. Still with slurred speech. Extremities: Symmetric 5 x 5 power. Skin: No rashes, lesions or ulcers Psychiatry: Judgement and insight appear normal. Mood & affect appropriate.   Data Reviewed: I have personally reviewed following labs and imaging studies  CBC:  Recent Labs Lab 04/23/16 1638 04/23/16 1650  WBC 7.0  --   NEUTROABS 3.7  --   HGB 14.6 15.3*  HCT 42.8  45.0  MCV 91.8  --   PLT 207  --    Basic Metabolic Panel:  Recent Labs Lab 04/23/16 1638 04/23/16 1650  NA 139 140  K 3.8 3.8  CL 105 105  CO2 26  --   GLUCOSE 73 69  BUN 10 10  CREATININE 0.73 0.70  CALCIUM 9.8  --    GFR: Estimated Creatinine Clearance: 72.9 mL/min (by C-G formula based on Cr of 0.7). Liver Function Tests:  Recent Labs Lab 04/23/16 1638  AST 24  ALT 18  ALKPHOS 83  BILITOT  0.6  PROT 8.0  ALBUMIN 4.1   No results for input(s): LIPASE, AMYLASE in the last 168 hours. No results for input(s): AMMONIA in the last 168 hours. Coagulation Profile:  Recent Labs Lab 04/23/16 1638  INR 1.09   Cardiac Enzymes: No results for input(s): CKTOTAL, CKMB, CKMBINDEX, TROPONINI in the last 168 hours. BNP (last 3 results) No results for input(s): PROBNP in the last 8760 hours. HbA1C: No results for input(s): HGBA1C in the last 72 hours. CBG: No results for input(s): GLUCAP in the last 168 hours. Lipid Profile:  Recent Labs  04/24/16 0228  CHOL 251*  HDL 54  LDLCALC 147*  TRIG 250*  CHOLHDL 4.6   Thyroid Function Tests: No results for input(s): TSH, T4TOTAL, FREET4, T3FREE, THYROIDAB in the last 72 hours. Anemia Panel: No results for input(s): VITAMINB12, FOLATE, FERRITIN, TIBC, IRON, RETICCTPCT in the last 72 hours. Urine analysis:    Component Value Date/Time   COLORURINE YELLOW 04/24/2016 0423   APPEARANCEUR CLOUDY* 04/24/2016 0423   LABSPEC 1.016 04/24/2016 0423   PHURINE 6.5 04/24/2016 0423   GLUCOSEU NEGATIVE 04/24/2016 0423   HGBUR NEGATIVE 04/24/2016 0423   BILIRUBINUR NEGATIVE 04/24/2016 0423   KETONESUR NEGATIVE 04/24/2016 0423   PROTEINUR NEGATIVE 04/24/2016 0423   NITRITE NEGATIVE 04/24/2016 0423   LEUKOCYTESUR TRACE* 04/24/2016 0423   Sepsis Labs: (procalcitonin:4,lacticidven:4)   )No results found for this or any previous visit (from the past 240 hour(s)).    Radiology Studies: Ct Head Wo Contrast 04/23/2016  Normal CT evaluation of the brain.  Mr Lodema Pilot Contrast 04/24/2016   1. 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 2. Moderately advanced cerebral white matter disease for age, nonspecific. Considerations include chronic small vessel ischemia, neurosarcoidosis, other vasculitis, and demyelination. No evidence of dural or leptomeningeal sarcoid involvement. 3. No evidence of major branch occlusion or  significant proximal stenosis.   Mr Maxine Glenn Head/brain Wo Cm 04/24/2016 1. 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 2. Moderately advanced cerebral white matter disease for age, nonspecific. Considerations include chronic small vessel ischemia, neurosarcoidosis, other vasculitis, and demyelination. No evidence of dural or leptomeningeal sarcoid involvement. 3. No evidence of major branch occlusion or significant proximal stenosis.     Scheduled Meds: . aspirin  300 mg Rectal Daily   Or  . aspirin  325 mg Oral Daily  . atorvastatin  40 mg Oral q1800  . doxepin  3 mg Oral QHS  . DULoxetine  60 mg Oral Daily  . gabapentin  300 mg Oral QHS  . hydrochlorothiazide  12.5 mg Oral q morning - 10a   Continuous Infusions:    LOS: 1 day    Time spent: 25 minutes  Greater than 50% of the time spent on counseling and coordinating the care.   Manson Passey, MD [facility] Pager [phone]  If 7PM-7AM, please contact night-coverage [email or web address] Password [email or web address]  04/24/2016, 3:26 PM

## 2016-04-24 NOTE — Progress Notes (Signed)
*  PRELIMINARY RESULTS* Vascular Ultrasound Carotid Duplex (Doppler) has been completed.  Findings suggest a 1-39% internal carotid artery stenosis bilaterally. Vertebral arteries are patent with antegrade flow.   Michelle Simonetti RVT, RDCS, RDMS Elsie StainGregory J Danel Requena 04/24/2016, 10:57 AM

## 2016-04-24 NOTE — Progress Notes (Signed)
Rehab Admissions Coordinator Note:  Patient was screened by Clois DupesBoyette, Kamiryn Bezanson Godwin for appropriateness for an Inpatient Acute Rehab Consult per PT and OT recommendation.s. At this time, we are recommending Inpatient Rehab consult.  Clois DupesBoyette, Shanzay Hepworth Godwin 04/24/2016, 3:26 PM  I can be reached at [phone].

## 2016-04-24 NOTE — Progress Notes (Signed)
  Echocardiogram 2D Echocardiogram has been performed.  Arvil ChacoFoster, Leodis Alcocer 04/24/2016, 10:33 AM

## 2016-04-24 NOTE — Progress Notes (Signed)
PT Cancellation Note  Patient Details Name: Rushie Nyhannez Rolin MRN: [id number] DOB: 03/09/1966   Cancelled Treatment:    Reason Eval/Treat Not Completed: Patient at procedure or test/unavailable Pt at MRI. Will follow up as time allows.   Blake DivineShauna A Maycen Degregory 04/24/2016, 10:55 AM Mylo RedShauna Gordie Crumby, PT, DPT [phone]

## 2016-04-24 NOTE — Progress Notes (Signed)
Met got admitted from ED earlier at [facility] with stroke symptoms, pt alert and oriented with family at bedside, telemetry monitor put on pt and verified, call light within pt's reach, v/s stable, will continue to monitor. Sharon Roberson, [name]

## 2016-04-24 NOTE — Progress Notes (Addendum)
Occupational Therapy Evaluation Patient Details Name: Sharon Roberson MRN: [id number] DOB: 24-Oct-1965 Today's Date: 04/24/2016    History of Present Illness 50 y.o. female with medical history significant for hypertension, neuropathy, dyslipidemia who presented to [facility] with new onset right-sided weakness and slurred speech.    Clinical Impression   Pt admitted with the above diagnoses and presents with below problem list. Pt will benefit from continued acute OT to address the below listed deficits and maximize independence with BADLs prior to d/c to venue below. PTA pt was independent with ADLs. Pt presents with right side weakness, decreased proprioception, decreased cognition including some impulsivity/decreased safety awareness/insight into deficits. Slurred speech noted with low volume at times. Pt is currently mod A with mobility, transfers and LB ADLs;  min to mod A with UB ADLs. 3-4 LOB events during session with transfers and mobility needing mod A to prevent fall. Educated family on having nursing/therapy personnel with pt when she is OOB. Chair alarm set. Pt would benefit from therapy in skilled care venue at d/c. Feel she would be a great candidate for CIR.     Follow Up Recommendations  CIR    Equipment Recommendations  Other (comment) (defer to next venue)    Recommendations for Other Services Speech consult     Precautions / Restrictions Precautions Precautions: Fall Restrictions Weight Bearing Restrictions: No      Mobility Bed Mobility Overal bed mobility: Needs Assistance Bed Mobility: Supine to Sit     Supine to sit: Min guard;HOB elevated     General bed mobility comments: extra time and effort. used rails. came to left side.   Transfers Overall transfer level: Needs assistance Equipment used: 1 person hand held assist Transfers: Sit to/from Stand Sit to Stand: Mod assist         General transfer comment: from EOB and recliner. Pt unsteady with  LOB upon initial standing requiring mod A to correct. Pt still reporting that she was fine and could walk ok.     Balance Overall balance assessment: Needs assistance Sitting-balance support: Bilateral upper extremity supported;Feet supported Sitting balance-Leahy Scale: Fair Sitting balance - Comments: Sat EOB and donned socks. Right trunk weakness noted when donning left sock with pt leaning onto right side out of midline.   Standing balance support: Single extremity supported;Bilateral upper extremity supported Standing balance-Leahy Scale: Poor Standing balance comment: 1 person HHA provided as well as mod A for balance. LOB in standing needing assist to prevent fall.                             ADL Overall ADL's : Needs assistance/impaired Eating/Feeding: Set up;Sitting Eating/Feeding Details (indicate cue type and reason): extra time and effort, dropping items in right hand at times Grooming: Minimal assistance;Sitting   Upper Body Bathing: Moderate assistance;Sitting   Lower Body Bathing: Moderate assistance;Sit to/from stand   Upper Body Dressing : Moderate assistance;Sitting   Lower Body Dressing: Moderate assistance;Sit to/from stand   Toilet Transfer: Moderate assistance;Ambulation   Toileting- Clothing Manipulation and Hygiene: Moderate assistance;Sit to/from stand   Tub/ Engineer, structural: Moderate assistance;Ambulation;3 in 1   Functional mobility during ADLs: Moderate assistance General ADL Comments: Pt completed in-room functional mobility around bed to sit up in recliner. Decreased safety, decreased insight into deficits, and some impulsivity noted. LOB 3-4x needing mod A to prevent fall. Chair alarm set and educated family on having nursing or therapy staff with pt when  walking.      Vision Additional Comments: Need to further assess in functional context.   Perception     Praxis      Pertinent Vitals/Pain Pain Assessment: No/denies pain      Hand Dominance Right   Extremity/Trunk Assessment Upper Extremity Assessment Upper Extremity Assessment: RUE deficits/detail RUE Deficits / Details: grossly 3/5, reports sensation intact, decreased proprioception; observed to fed self with RUE with increased time and effort. dropping items in right hand at times. RUE Sensation: decreased proprioception RUE Coordination: decreased fine motor;decreased gross motor   Lower Extremity Assessment Lower Extremity Assessment: Defer to PT evaluation   Cervical / Trunk Assessment Cervical / Trunk Assessment:  (right side trunk weakness noted)   Communication Communication Communication: Other (comment) (slurred speech noted, low volume at times)   Cognition Arousal/Alertness: Awake/alert ("so tired") Behavior During Therapy: Flat affect;Impulsive Overall Cognitive Status: Impaired/Different from baseline Area of Impairment: Attention;Following commands;Safety/judgement;Problem solving   Current Attention Level: Sustained Memory: Decreased recall of precautions Following Commands: Follows one step commands inconsistently Safety/Judgement: Decreased awareness of safety;Decreased awareness of deficits   Problem Solving: Difficulty sequencing;Slow processing;Requires verbal cues     General Comments       Exercises       Shoulder Instructions      Home Living Family/patient expects to be discharged to:: Private residence Living Arrangements: Other (Comment) ("friend") Available Help at Discharge: Family;Available 24 hours/day Type of Home: House Home Access: Stairs to enter [employer] of Steps: 3;2in the back Entrance Stairs-Rails: Left Home Layout: One level     Bathroom Shower/Tub: Tub/shower unit Shower/tub characteristics: Door       Home Equipment: Grab bars - tub/shower          Prior Functioning/Environment Level of Independence: Independent             OT Diagnosis: Cognitive  deficits;Disturbance of vision;Hemiplegia dominant side   OT Problem List: Decreased strength;Decreased range of motion;Decreased activity tolerance;Impaired balance (sitting and/or standing);Impaired vision/perception;Decreased coordination;Decreased cognition;Decreased safety awareness;Decreased knowledge of use of DME or AE;Decreased knowledge of precautions;Impaired tone;Impaired UE functional use   OT Treatment/Interventions: Self-care/ADL training;Therapeutic exercise;Neuromuscular education;DME and/or AE instruction;Therapeutic activities;Cognitive remediation/compensation;Visual/perceptual remediation/compensation;Patient/family education;Balance training    OT Goals(Current goals can be found in the care plan section) Acute Rehab OT Goals Patient Stated Goal: home OT Goal Formulation: With patient/family Time For Goal Achievement: 05/08/16 Potential to Achieve Goals: Good ADL Goals Pt Will Perform Eating: with modified independence;sitting Pt Will Perform Grooming: with modified independence;sitting;with set-up Pt Will Perform Upper Body Bathing: with set-up;sitting Pt Will Perform Lower Body Bathing: sit to/from stand;with adaptive equipment;with supervision Pt Will Perform Upper Body Dressing: with modified independence;sitting Pt Will Perform Lower Body Dressing: with supervision;with adaptive equipment;sit to/from stand Pt Will Transfer to Toilet: ambulating;with supervision Pt Will Perform Toileting - Clothing Manipulation and hygiene: with supervision;sitting/lateral leans;sit to/from stand;with set-up Pt Will Perform Tub/Shower Transfer: with min guard assist;ambulating Pt/caregiver will Perform Home Exercise Program: Increased ROM;Increased strength;Right Upper extremity;With written HEP provided;With theraputty [facility])  OT Frequency: Min 3X/week   Barriers to D/C:            Co-evaluation              End of Session Equipment Utilized During Treatment: Gait  belt Nurse Communication: Mobility status;Other (comment) (decreased safety awareness/insight into deficits; chair alar)  Activity Tolerance: Patient tolerated treatment well;Patient limited by fatigue Patient left: in chair;with call bell/phone within reach;with chair alarm set;with family/visitor present;Other (comment) (  with PT)   Time: 1610-9604: 1239-1312 OT Time Calculation (min): 33 min Charges:  OT General Charges $OT Visit: 1 Procedure OT Evaluation $OT Eval Moderate Complexity: 1 Procedure OT Treatments $Self Care/Home Management : 8-22 mins G-Codes:    Pilar GrammesMathews, Suheily Birks H 04/24/2016, 2:32 PM

## 2016-04-24 NOTE — Evaluation (Signed)
Physical Therapy Evaluation Patient Details Name: Sharon Roberson MRN: [id number] DOB: 09/04/1966 Today's Date: 04/24/2016   History of Present Illness  50 y.o. female with medical history significant for hypertension, neuropathy, dyslipidemia who [facility] with new onset right-sided weakness and slurred speech. 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  Clinical Impression  Patient presents with right sided weakness, impaired safety awareness, balance deficits and impulsivity s/p CVA impacting mobility. Tolerated gait training with Min A for balance/safety due to right knee instability. Demonstrates knee hyperextension thrust RLE during stance phase of gait to prevent buckling. Discussed need for rehab at discharge. Pt motivated and eager to get to rehab so she can return to PLOF. Recommend CIR. Will follow acutely to maximize independence and mobility prior to return home with support of friend.    Follow Up Recommendations CIR    Equipment Recommendations  Other (comment) (TBA)    Recommendations for Other Services OT consult;Rehab consult;Speech consult     Precautions / Restrictions Precautions Precautions: Fall Restrictions Weight Bearing Restrictions: No      Mobility  Bed Mobility Overal bed mobility: Needs Assistance Bed Mobility: Supine to Sit     Supine to sit: Min guard;HOB elevated     General bed mobility comments: Up in chair upon PT arrival.   Transfers Overall transfer level: Needs assistance Equipment used: 1 person hand held assist Transfers: Sit to/from Stand Sit to Stand: Min assist         General transfer comment: Min A to boost from chair x2. Impulsive. Attempting to stand without assist.   Ambulation/Gait Ambulation/Gait assistance: Min assist Ambulation Distance (Feet): 100 Feet (x2 bouts) Assistive device:  (rail in hallway) Gait Pattern/deviations: Step-to pattern;Decreased stance time - right;Decreased  weight shift to right;Narrow base of support Gait velocity: decreased Gait velocity interpretation: Below normal speed for age/gender General Gait Details: Pt with knee hyperextension thrust RLE during stance phase due to right knee instability. 1 seated rest break. Fatigues quickly. Right lateral trunk lean with decreased arm swing.  Stairs            Wheelchair Mobility    Modified Rankin (Stroke Patients Only) Modified Rankin (Stroke Patients Only) Pre-Morbid Rankin Score: No symptoms Modified Rankin: Moderately severe disability     Balance Overall balance assessment: Needs assistance Sitting-balance support: Feet supported;No upper extremity supported Sitting balance-Leahy Scale: Fair Sitting balance - Comments: Sat EOB and donned socks. Right trunk weakness noted when donning left sock with pt leaning onto right side out of midline.   Standing balance support: During functional activity Standing balance-Leahy Scale: Poor Standing balance comment: Requires UE support during dynamic standing for balance and due to impulsivity.                              Pertinent Vitals/Pain Pain Assessment: No/denies pain    Home Living Family/patient expects to be discharged to:: Private residence Living Arrangements: Other (Comment) ("my man") Available Help at Discharge: Family;Available 24 hours/day Type of Home: House Home Access: Stairs to enter Entrance Stairs-Rails: Left Entrance Stairs-Number of Steps: 3;2in the back Home Layout: One level Home Equipment: Grab bars - tub/shower      Prior Function Level of Independence: Independent               Hand Dominance   Dominant Hand: Right    Extremity/Trunk Assessment   Upper Extremity Assessment: Defer to OT evaluation  RUE Deficits / Details: grossly 3/5, reports sensation intact, decreased proprioception; observed to fed self with RUE with increased time and effort. dropping items in right hand at  times.   RUE Sensation: decreased proprioception     Lower Extremity Assessment: RLE deficits/detail RLE Deficits / Details: Grossly ~2+/5 quadriceps, hip flexion, knee flexion.    Cervical / Trunk Assessment:  (right side trunk weakness noted)  Communication   Communication: No difficulties  Cognition Arousal/Alertness: Awake/alert Behavior During Therapy: Flat affect Overall Cognitive Status: Impaired/Different from baseline Area of Impairment: Attention;Following commands;Safety/judgement;Problem solving   Current Attention Level: Sustained Memory: Decreased recall of precautions Following Commands: Follows one step commands inconsistently Safety/Judgement: Decreased awareness of safety;Decreased awareness of deficits   Problem Solving: Difficulty sequencing;Slow processing;Requires verbal cues General Comments: Pt with better safety awareness and insight into deficits after gait training, "i am not walking like normal."    General Comments      Exercises        Assessment/Plan    PT Assessment Patient needs continued PT services  PT Diagnosis Difficulty walking;Abnormality of gait   PT Problem List Decreased strength;Decreased cognition;Decreased coordination;Decreased activity tolerance;Decreased balance;Decreased mobility;Decreased safety awareness;Decreased knowledge of use of DME;Decreased range of motion  PT Treatment Interventions Balance training;Gait training;Functional mobility training;Therapeutic activities;Therapeutic exercise;Patient/family education;Stair training;Neuromuscular re-education;DME instruction   PT Goals (Current goals can be found in the Care Plan section) Acute Rehab PT Goals Patient Stated Goal: to go back to doing what I was doing before PT Goal Formulation: With patient Time For Goal Achievement: 05/08/16 Potential to Achieve Goals: Good    Frequency Min 4X/week   Barriers to discharge        Co-evaluation                End of Session Equipment Utilized During Treatment: Gait belt Activity Tolerance: Patient limited by fatigue;Patient tolerated treatment well Patient left: in chair;with call bell/phone within reach;with chair alarm set;with family/visitor present Nurse Communication: Mobility status         Time: 4098-1191 PT Time Calculation (min) (ACUTE ONLY): 24 min   Charges:   PT Evaluation $PT Eval Moderate Complexity: 1 Procedure PT Treatments $Gait Training: 8-22 mins   PT G Codes:        Joaopedro Eschbach A Sara Selvidge 04/24/2016, 3:01 PM Mylo Red, PT, DPT [phone]

## 2016-04-24 NOTE — Consult Note (Signed)
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 right handed female with history of hypertension, hyperlipidemia, tobacco abuse. Per chart review patient lives with significant other versus friend. One level home. Independent prior to admission. Presented 04/23/2016 with slurred speech and right-sided weakness. Cranial CT scan negative. 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 No evidence of major occlusion or significant proximal stenosis per MRA. Patient did not receive TPA. Echocardiogram with ejection fraction of 65% grade 1 diastolic dysfunction. Carotid Dopplers with no ICA stenosis. Cardiology services consulted placed on aspirin for CVA prophylaxis. Tolerating a regular diet.  Patient states that she was independent prior to admission and was doing housework as well. Did not utilize assisted device  Review of Systems  Constitutional: Negative for fever and chills.  HENT: Negative for hearing loss.        Occasional headache  Eyes: Negative for blurred vision and double vision.  Respiratory: Positive for cough. Negative for shortness of breath.   Cardiovascular: Negative for chest pain, palpitations and leg swelling.  Gastrointestinal: Positive for constipation. Negative for nausea and vomiting.  Genitourinary: Negative for dysuria and hematuria.  Musculoskeletal: Positive for myalgias.  Skin: Negative for rash.  Neurological: Positive for weakness. Negative for seizures.  All other systems reviewed and are negative.  Past Medical History  Diagnosis Date  . Hypertension   . Hypercholesteremia   . Sarcoidosis (HCC)   . Pneumothorax    History reviewed. No pertinent past surgical history. No family history on file. Social History:  reports that she has been smoking Cigarettes.  She has been smoking about 0.50 packs  per day. She does not have any smokeless tobacco history on file. She reports that she drinks alcohol. She reports that she does not use illicit drugs. Allergies:  Allergies  Allergen Reactions  . Penicillins Anaphylaxis    Has patient had a PCN reaction causing immediate rash, facial/tongue/throat swelling, SOB or lightheadedness with hypotension: Yes Has patient had a PCN reaction causing severe rash involving mucus membranes or skin necrosis: No Has patient had a PCN reaction that required hospitalization Yes Has patient had a PCN reaction occurring within the last 10 years: Yes If all of the above answers are "NO", then may proceed with Cephalosporin use.   . Sulfamethoxazole Anaphylaxis   Medications Prior to Admission  Medication Sig Dispense Refill  . Doxepin HCl (SILENOR) 3 MG TABS Take 3 mg by mouth daily.    . DULoxetine (CYMBALTA) 60 MG capsule Take 60 mg by mouth daily.    . fluticasone (FLONASE) 50 MCG/ACT nasal spray Place 1-2 sprays into both nostrils daily.    Marland Kitchen gabapentin (NEURONTIN) 300 MG capsule Take 300 mg by mouth at bedtime.    . hydrochlorothiazide (HYDRODIURIL) 12.5 MG tablet Take 12.5 mg by mouth every morning.    Marland Kitchen ibuprofen (ADVIL,MOTRIN) 200 MG tablet Take 800 mg by mouth every 6 (six) hours as needed for mild pain or moderate pain.    . methocarbamol (ROBAXIN) 500 MG tablet Take 500 mg by mouth every 4 (four) hours as needed for muscle spasms.    . simvastatin (ZOCOR) 80 MG tablet Take 80 mg by mouth at bedtime.      Home: Home Living Family/patient expects to be discharged to:: Private residence Living Arrangements: Other (Comment) ("my man") Available Help at  Discharge: Family, Available 24 hours/day Type of Home: House Home Access: Stairs to enter [employer] of Steps: 3;2in the back Entrance Stairs-Rails: Left Home Layout: One level Bathroom Shower/Tub: Tub/shower unit Home Equipment: Grab bars - tub/shower  Functional History: Prior  Function Level of Independence: Independent Functional Status:  Mobility: Bed Mobility Overal bed mobility: Needs Assistance Bed Mobility: Supine to Sit Supine to sit: Min guard, HOB elevated General bed mobility comments: Up in chair upon PT arrival.  Transfers Overall transfer level: Needs assistance Equipment used: 1 person hand held assist Transfers: Sit to/from Stand Sit to Stand: Min assist General transfer comment: Min A to boost from chair x2. Impulsive. Attempting to stand without assist.  Ambulation/Gait Ambulation/Gait assistance: Min assist Ambulation Distance (Feet): 100 Feet (x2 bouts) Assistive device:  (rail in hallway) Gait Pattern/deviations: Step-to pattern, Decreased stance time - right, Decreased weight shift to right, Narrow base of support General Gait Details: Pt with knee hyperextension thrust RLE during stance phase due to right knee instability. 1 seated rest break. Fatigues quickly. Right lateral trunk lean with decreased arm swing. Gait velocity: decreased Gait velocity interpretation: Below normal speed for age/gender    ADL: ADL Overall ADL's : Needs assistance/impaired Eating/Feeding: Set up, Sitting Eating/Feeding Details (indicate cue type and reason): extra time and effort, dropping items in right hand at times Grooming: Minimal assistance, Sitting Upper Body Bathing: Moderate assistance, Sitting Lower Body Bathing: Moderate assistance, Sit to/from stand Upper Body Dressing : Moderate assistance, Sitting Lower Body Dressing: Moderate assistance, Sit to/from stand Toilet Transfer: Moderate assistance, Ambulation Toileting- Clothing Manipulation and Hygiene: Moderate assistance, Sit to/from stand Tub/ Shower Transfer: Moderate assistance, Ambulation, 3 in 1 Functional mobility during ADLs: Moderate assistance General ADL Comments: Pt completed in-room functional mobility around bed to sit up in recliner. Decreased safety, decreased insight into  deficits, and some impulsivity noted. LOB 3-4x needing mod A to prevent fall. Chair alarm set and educated family on having nursing or therapy staff with pt when walking.   Cognition: Cognition Overall Cognitive Status: Impaired/Different from baseline Orientation Level: Oriented X4 Cognition Arousal/Alertness: Awake/alert Behavior During Therapy: Flat affect Overall Cognitive Status: Impaired/Different from baseline Area of Impairment: Attention, Following commands, Safety/judgement, Problem solving Current Attention Level: Sustained Memory: Decreased recall of precautions Following Commands: Follows one step commands inconsistently Safety/Judgement: Decreased awareness of safety, Decreased awareness of deficits Problem Solving: Difficulty sequencing, Slow processing, Requires verbal cues General Comments: Pt with better safety awareness and insight into deficits after gait training, "i am not walking like normal."  Blood pressure 118/65, pulse 74, temperature 98.8 F (37.1 C), temperature source Oral, resp. rate 20, height 4\' 10"  (1.473 m), weight 75.8 kg (167 lb 1.7 oz), SpO2 100 %. Physical Exam  Vitals reviewed. Constitutional: She is oriented to person, place, and time.  HENT:  Head: Normocephalic.  Eyes: EOM are normal.  Neck: Normal range of motion. Neck supple. No thyromegaly present.  Cardiovascular: Normal rate and regular rhythm.   Respiratory: Effort normal and breath sounds normal. No respiratory distress.  GI: Soft. Bowel sounds are normal. She exhibits no distension.  Neurological: She is alert and oriented to person, place, and time.  Follows full commands. Fair awareness of deficits  Skin: Skin is warm and dry.  Motor strength is 3+ in the right deltoid, biceps, triceps, grip, hip flexor, knee extensor, trace  right ankle dorsiflexor and plantar flexor Left side is 5/5 strength. Sensation intact to light touch in bilateral upper and lower limbs. No  evidence of  abnormal tone. No evidence of dysmetria  Results for orders placed or performed during the hospital encounter of 04/23/16 (from the past 24 hour(s))  Ethanol     Status: None   Collection Time: 04/23/16  4:38 PM  Result Value Ref Range   Alcohol, Ethyl (B) <5 <5 mg/dL  Protime-INR     Status: None   Collection Time: 04/23/16  4:38 PM  Result Value Ref Range   Prothrombin Time 14.3 11.6 - 15.2 seconds   INR 1.09 0.00 - 1.49  APTT     Status: None   Collection Time: 04/23/16  4:38 PM  Result Value Ref Range   aPTT 27 24 - 37 seconds  CBC     Status: None   Collection Time: 04/23/16  4:38 PM  Result Value Ref Range   WBC 7.0 4.0 - 10.5 K/uL   RBC 4.66 3.87 - 5.11 MIL/uL   Hemoglobin 14.6 12.0 - 15.0 g/dL   HCT 16.1 09.6 - 04.5 %   MCV 91.8 78.0 - 100.0 fL   MCH 31.3 26.0 - 34.0 pg   MCHC 34.1 30.0 - 36.0 g/dL   RDW 40.9 81.1 - 91.4 %   Platelets 207 150 - 400 K/uL  Differential     Status: None   Collection Time: 04/23/16  4:38 PM  Result Value Ref Range   Neutrophils Relative % 53 %   Neutro Abs 3.7 1.7 - 7.7 K/uL   Lymphocytes Relative 35 %   Lymphs Abs 2.5 0.7 - 4.0 K/uL   Monocytes Relative 10 %   Monocytes Absolute 0.7 0.1 - 1.0 K/uL   Eosinophils Relative 2 %   Eosinophils Absolute 0.1 0.0 - 0.7 K/uL   Basophils Relative 0 %   Basophils Absolute 0.0 0.0 - 0.1 K/uL  Comprehensive metabolic panel     Status: None   Collection Time: 04/23/16  4:38 PM  Result Value Ref Range   Sodium 139 135 - 145 mmol/L   Potassium 3.8 3.5 - 5.1 mmol/L   Chloride 105 101 - 111 mmol/L   CO2 26 22 - 32 mmol/L   Glucose, Bld 73 65 - 99 mg/dL   BUN 10 6 - 20 mg/dL   Creatinine, Ser 7.82 0.44 - 1.00 mg/dL   Calcium 9.8 8.9 - 95.6 mg/dL   Total Protein 8.0 6.5 - 8.1 g/dL   Albumin 4.1 3.5 - 5.0 g/dL   AST 24 15 - 41 U/L   ALT 18 14 - 54 U/L   Alkaline Phosphatase 83 38 - 126 U/L   Total Bilirubin 0.6 0.3 - 1.2 mg/dL   GFR calc non Af Amer >60 >60 mL/min   GFR calc Af Amer >60 >60  mL/min   Anion gap 8 5 - 15  I-stat troponin, ED (not at [facility], [facility])     Status: None   Collection Time: 04/23/16  4:48 PM  Result Value Ref Range   Troponin i, poc 0.00 0.00 - 0.08 ng/mL   Comment 3          I-Stat Chem 8, ED  (not at [facility], [facility])     Status: Abnormal   Collection Time: 04/23/16  4:50 PM  Result Value Ref Range   Sodium 140 135 - 145 mmol/L   Potassium 3.8 3.5 - 5.1 mmol/L   Chloride 105 101 - 111 mmol/L   BUN 10 6 - 20 mg/dL   Creatinine, Ser 2.13 0.44 - 1.00 mg/dL  Glucose, Bld 69 65 - 99 mg/dL   Calcium, Ion 1.611.09 (L) 1.13 - 1.30 mmol/L   TCO2 23 0 - 100 mmol/L   Hemoglobin 15.3 (H) 12.0 - 15.0 g/dL   HCT 09.645.0 04.536.0 - 40.946.0 %  Lipid panel     Status: Abnormal   Collection Time: 04/24/16  2:28 AM  Result Value Ref Range   Cholesterol 251 (H) 0 - 200 mg/dL   Triglycerides 811250 (H) <150 mg/dL   HDL 54 >91>40 mg/dL   Total CHOL/HDL Ratio 4.6 RATIO   VLDL 50 (H) 0 - 40 mg/dL   LDL Cholesterol 478147 (H) 0 - 99 mg/dL  Urine rapid drug screen (hosp performed)not at [facility]     Status: Abnormal   Collection Time: 04/24/16  4:23 AM  Result Value Ref Range   Opiates NONE DETECTED NONE DETECTED   Cocaine NONE DETECTED NONE DETECTED   Benzodiazepines NONE DETECTED NONE DETECTED   Amphetamines NONE DETECTED NONE DETECTED   Tetrahydrocannabinol POSITIVE (A) NONE DETECTED   Barbiturates NONE DETECTED NONE DETECTED  Urinalysis, Routine w reflex microscopic (not at [facility])     Status: Abnormal   Collection Time: 04/24/16  4:23 AM  Result Value Ref Range   Color, Urine YELLOW YELLOW   APPearance CLOUDY (A) CLEAR   Specific Gravity, Urine 1.016 1.005 - 1.030   pH 6.5 5.0 - 8.0   Glucose, UA NEGATIVE NEGATIVE mg/dL   Hgb urine dipstick NEGATIVE NEGATIVE   Bilirubin Urine NEGATIVE NEGATIVE   Ketones, ur NEGATIVE NEGATIVE mg/dL   Protein, ur NEGATIVE NEGATIVE mg/dL   Nitrite NEGATIVE NEGATIVE   Leukocytes, UA TRACE (A) NEGATIVE  Urine microscopic-add on     Status: Abnormal    Collection Time: 04/24/16  4:23 AM  Result Value Ref Range   Squamous Epithelial / LPF 0-5 (A) NONE SEEN   WBC, UA 0-5 0 - 5 WBC/hpf   RBC / HPF NONE SEEN 0 - 5 RBC/hpf   Bacteria, UA RARE (A) NONE SEEN   Urine-Other AMORPHOUS URATES/PHOSPHATES    Ct Head Wo Contrast  04/23/2016  CLINICAL DATA:  Code stroke. EXAM: CT HEAD WITHOUT CONTRAST TECHNIQUE: Contiguous axial images were obtained from the base of the skull through the vertex without intravenous contrast. COMPARISON:  None. FINDINGS: There is no evidence for acute hemorrhage, hydrocephalus, mass lesion, or abnormal extra-axial fluid collection. No definite CT evidence for acute infarction. The visualized paranasal sinuses and mastoid air cells are clear. IMPRESSION: Normal CT evaluation of the brain. Critical Value/emergent results were called by telephone at the time of interpretation on 04/23/2016 at 4:55 pm to April, working with Dr. Amada JupiterKirkpatrick, who verbally acknowledged these results. Electronically Signed   By: Kennith CenterEric  Mansell M.D.   On: 04/23/2016 16:56   Mr Laqueta JeanBrain W GNWo Contrast  04/24/2016  CLINICAL DATA:  New onset right-sided weakness. Slurred speech. History of hypertension, hypercholesterolemia, and sarcoidosis. EXAM: MRI HEAD WITHOUT AND WITH CONTRAST MRA HEAD WITHOUT CONTRAST TECHNIQUE: Multiplanar, multiecho pulse sequences of the brain and surrounding structures were obtained without and with intravenous contrast. Angiographic images of the head were obtained using MRA technique without contrast. CONTRAST:  15mL MULTIHANCE GADOBENATE DIMEGLUMINE 529 MG/ML IV SOLN COMPARISON:  Head CT 04/23/2016 FINDINGS: MRI HEAD FINDINGS There is a 9 mm acute infarct in the left cingulate gyrus. There is also a 1.2 cm acute infarct in the left paramedian pons. There is no evidence of intracranial hemorrhage, mass, midline shift, or extra-axial fluid collection. The ventricles  and sulci are normal. Patchy T2 hyperintensities are present in the  predominantly deep cerebral white matter bilaterally and are moderately advanced for age. No abnormal brain parenchymal or meningeal enhancement is identified. Orbits are unremarkable. No significant inflammatory disease is seen in the paranasal sinuses are mastoid air cells. Major intracranial vascular flow voids are preserved. MRA HEAD FINDINGS There is mild motion artifact. The visualized distal vertebral arteries are patent with the left being minimally larger than the right. Left PICA origin is patent. The right PICA origin was not imaged. SCA origins are patent. Basilar artery is widely patent. Posterior communicating arteries are not identified. PCAs are patent without evidence of significant proximal stenosis. The internal carotid arteries are patent from skullbase to carotid termini. Focal areas diminished signal the in the proximal right petrous ICA is favored to be artifactual, with less prominent artifact on the contralateral side at the same level. The ACAs and MCAs are patent without evidence of major branch occlusion or significant proximal stenosis. No intracranial aneurysm is identified. IMPRESSION: 1. 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 2. Moderately advanced cerebral white matter disease for age, nonspecific. Considerations include chronic small vessel ischemia, neurosarcoidosis, other vasculitis, and demyelination. No evidence of dural or leptomeningeal sarcoid involvement. 3. No evidence of major branch occlusion or significant proximal stenosis. Electronically Signed   By: Sebastian Ache M.D.   On: 04/24/2016 09:51   Mr Maxine Glenn Head/brain Wo Cm  04/24/2016  CLINICAL DATA:  New onset right-sided weakness. Slurred speech. History of hypertension, hypercholesterolemia, and sarcoidosis. EXAM: MRI HEAD WITHOUT AND WITH CONTRAST MRA HEAD WITHOUT CONTRAST TECHNIQUE: Multiplanar, multiecho pulse sequences of the brain and surrounding structures were obtained without and with  intravenous contrast. Angiographic images of the head were obtained using MRA technique without contrast. CONTRAST:  15mL MULTIHANCE GADOBENATE DIMEGLUMINE 529 MG/ML IV SOLN COMPARISON:  Head CT 04/23/2016 FINDINGS: MRI HEAD FINDINGS There is a 9 mm acute infarct in the left cingulate gyrus. There is also a 1.2 cm acute infarct in the left paramedian pons. There is no evidence of intracranial hemorrhage, mass, midline shift, or extra-axial fluid collection. The ventricles and sulci are normal. Patchy T2 hyperintensities are present in the predominantly deep cerebral white matter bilaterally and are moderately advanced for age. No abnormal brain parenchymal or meningeal enhancement is identified. Orbits are unremarkable. No significant inflammatory disease is seen in the paranasal sinuses are mastoid air cells. Major intracranial vascular flow voids are preserved. MRA HEAD FINDINGS There is mild motion artifact. The visualized distal vertebral arteries are patent with the left being minimally larger than the right. Left PICA origin is patent. The right PICA origin was not imaged. SCA origins are patent. Basilar artery is widely patent. Posterior communicating arteries are not identified. PCAs are patent without evidence of significant proximal stenosis. The internal carotid arteries are patent from skullbase to carotid termini. Focal areas diminished signal the in the proximal right petrous ICA is favored to be artifactual, with less prominent artifact on the contralateral side at the same level. The ACAs and MCAs are patent without evidence of major branch occlusion or significant proximal stenosis. No intracranial aneurysm is identified. IMPRESSION: 1. 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 2. Moderately advanced cerebral white matter disease for age, nonspecific. Considerations include chronic small vessel ischemia, neurosarcoidosis, other vasculitis, and demyelination. No evidence of dural or  leptomeningeal sarcoid involvement. 3. No evidence of major branch occlusion or significant proximal stenosis. Electronically  Signed   By: Sebastian Ache M.D.   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 1. Does the need for close, 24 hr/day medical supervision in concert with the patient's rehab needs make it unreasonable for this patient to be served in a less intensive setting? Yes 2. Co-Morbidities requiring supervision/potential complications: Hypertension, hyperlipidemia, dysarthria 3. Due to bladder management, bowel management, safety, skin/wound care, disease management, medication administration, pain management and patient education, does the patient require 24 hr/day rehab nursing? Yes 4. Does the patient require coordinated care of a physician, rehab nurse, PT (1-2 hrs/day, 5 days/week), OT (1-2 hrs/day, 5 days/week) and SLP (0.5-1 hrs/day, 5 days/week) to address physical and functional deficits in the context of the above medical diagnosis(es)? Yes Addressing deficits in the following areas: balance, endurance, locomotion, strength, transferring, bowel/bladder control, bathing, dressing, feeding, grooming, toileting, cognition, speech and psychosocial support 5. Can the patient actively participate in an intensive therapy program of at least 3 hrs of therapy per day at least 5 days per week? Yes 6. The potential for patient to make measurable gains while on inpatient rehab is excellent 7. Anticipated functional outcomes upon discharge from inpatient rehab are modified independent and supervision  with PT, modified independent and supervision with OT, modified independent and supervision with SLP. 8. Estimated rehab length of stay to reach the above functional goals is: 10-14 days 9. Does the patient have adequate social supports and living environment to accommodate these discharge functional goals?  Yes 10. Anticipated D/C setting: Home 11. Anticipated post D/C treatments: HH therapy 12. Overall Rehab/Functional Prognosis: excellent  RECOMMENDATIONS: This patient's condition is appropriate for continued rehabilitative care in the following setting: CIR Patient has agreed to participate in recommended program. Yes Note that insurance prior authorization may be required for reimbursement for recommended care.  Comment: Needs to complete stroke workup    04/24/2016

## 2016-04-24 NOTE — Progress Notes (Signed)
OT Cancellation Note  Patient Details Name: Sharon Roberson MRN: [id number] DOB: 12/06/1965   Cancelled Treatment:    Reason Eval/Treat Not Completed: Patient at procedure or test/ unavailable;Other (comment) (Pt at MRI.) Will reattempt as schedule permits.   Raynald KempKathryn Aliahna Statzer OTR/L Pager: [phone]  04/24/2016, 9:09 AM

## 2016-04-25 ENCOUNTER — Inpatient Hospital Stay (HOSPITAL_COMMUNITY)
Admission: RE | Admit: 2016-04-25 | Discharge: 2016-04-30 | DRG: 057 | Disposition: A | Discharge status: Home Health | Payer: Medicare Other | Source: Intra-hospital | Attending: Physical Medicine & Rehabilitation | Admitting: Physical Medicine & Rehabilitation

## 2016-04-25 DIAGNOSIS — E785 Hyperlipidemia, unspecified: Secondary | ICD-10-CM | POA: Diagnosis present

## 2016-04-25 DIAGNOSIS — K5900 Constipation, unspecified: Secondary | ICD-10-CM | POA: Diagnosis present

## 2016-04-25 DIAGNOSIS — I6350 Cerebral infarction due to unspecified occlusion or stenosis of unspecified cerebral artery: Secondary | ICD-10-CM

## 2016-04-25 DIAGNOSIS — F329 Major depressive disorder, single episode, unspecified: Secondary | ICD-10-CM | POA: Diagnosis present

## 2016-04-25 DIAGNOSIS — Z79899 Other long term (current) drug therapy: Secondary | ICD-10-CM | POA: Diagnosis not present

## 2016-04-25 DIAGNOSIS — Z888 Allergy status to other drugs, medicaments and biological substances status: Secondary | ICD-10-CM

## 2016-04-25 DIAGNOSIS — R112 Nausea with vomiting, unspecified: Secondary | ICD-10-CM | POA: Diagnosis not present

## 2016-04-25 DIAGNOSIS — F17210 Nicotine dependence, cigarettes, uncomplicated: Secondary | ICD-10-CM | POA: Diagnosis present

## 2016-04-25 DIAGNOSIS — I69351 Hemiplegia and hemiparesis following cerebral infarction affecting right dominant side: Principal | ICD-10-CM

## 2016-04-25 DIAGNOSIS — D869 Sarcoidosis, unspecified: Secondary | ICD-10-CM | POA: Diagnosis present

## 2016-04-25 DIAGNOSIS — Z880 Allergy status to penicillin: Secondary | ICD-10-CM | POA: Diagnosis not present

## 2016-04-25 DIAGNOSIS — I10 Essential (primary) hypertension: Secondary | ICD-10-CM | POA: Diagnosis present

## 2016-04-25 DIAGNOSIS — I639 Cerebral infarction, unspecified: Secondary | ICD-10-CM | POA: Diagnosis present

## 2016-04-25 LAB — VAS US CAROTID
LCCADDIAS: -28 cm/s
LCCAPSYS: 83 cm/s
LEFT VERTEBRAL DIAS: 25 cm/s
LICADDIAS: -34 cm/s
LICAPSYS: -38 cm/s
Left CCA dist sys: -62 cm/s
Left CCA prox dias: 27 cm/s
Left ICA dist sys: -68 cm/s
Left ICA prox dias: -17 cm/s
RCCAPSYS: 98 cm/s
RIGHT ECA DIAS: -15 cm/s
Right CCA prox dias: 21 cm/s
Right cca dist sys: -80 cm/s

## 2016-04-25 MED ORDER — SORBITOL 70 % SOLN
30.0000 mL | Freq: Every day | Status: DC | PRN
  Administered 2016-04-27 – 2016-04-28 (×2): 30 mL via ORAL
  Filled 2016-04-25 (×2): qty 30

## 2016-04-25 MED ORDER — FAMOTIDINE 40 MG PO TABS
40.0000 mg | ORAL_TABLET | Freq: Every day | ORAL | Status: DC
  Administered 2016-04-26 – 2016-04-30 (×5): 40 mg via ORAL
  Filled 2016-04-25 (×5): qty 1

## 2016-04-25 MED ORDER — GABAPENTIN 300 MG PO CAPS
300.0000 mg | ORAL_CAPSULE | Freq: Every day | ORAL | Status: DC
  Administered 2016-04-25 – 2016-04-29 (×5): 300 mg via ORAL
  Filled 2016-04-25 (×5): qty 1

## 2016-04-25 MED ORDER — ONDANSETRON HCL 4 MG/2ML IJ SOLN
4.0000 mg | Freq: Four times a day (QID) | INTRAMUSCULAR | Status: DC | PRN
  Administered 2016-04-27: 4 mg via INTRAVENOUS
  Filled 2016-04-25: qty 2

## 2016-04-25 MED ORDER — FAMOTIDINE 40 MG PO TABS
40.0000 mg | ORAL_TABLET | Freq: Every day | ORAL | Status: DC
Start: 2016-04-25 — End: 2016-09-04

## 2016-04-25 MED ORDER — NICOTINE 14 MG/24HR TD PT24: 14.0000 mg | MEDICATED_PATCH | Freq: Every day | TRANSDERMAL | Status: DC

## 2016-04-25 MED ORDER — NICOTINE 14 MG/24HR TD PT24
14.0000 mg | MEDICATED_PATCH | Freq: Every day | TRANSDERMAL | Status: DC
  Administered 2016-04-26 – 2016-04-30 (×5): 14 mg via TRANSDERMAL
  Filled 2016-04-25 (×5): qty 1

## 2016-04-25 MED ORDER — ACETAMINOPHEN 325 MG PO TABS
650.0000 mg | ORAL_TABLET | Freq: Four times a day (QID) | ORAL | Status: DC | PRN
  Administered 2016-04-26: 650 mg via ORAL
  Filled 2016-04-25 (×2): qty 2

## 2016-04-25 MED ORDER — ALUM & MAG HYDROXIDE-SIMETH 200-200-20 MG/5ML PO SUSP: 30.0000 mL | ORAL | Status: DC | PRN

## 2016-04-25 MED ORDER — ONDANSETRON HCL 4 MG PO TABS: 4.0000 mg | ORAL_TABLET | Freq: Four times a day (QID) | ORAL | Status: DC | PRN

## 2016-04-25 MED ORDER — ASPIRIN 300 MG RE SUPP: 300.0000 mg | Freq: Every day | RECTAL | Status: DC

## 2016-04-25 MED ORDER — SENNOSIDES-DOCUSATE SODIUM 8.6-50 MG PO TABS: 1.0000 | ORAL_TABLET | Freq: Every evening | ORAL | Status: DC | PRN

## 2016-04-25 MED ORDER — ATORVASTATIN CALCIUM 40 MG PO TABS
40.0000 mg | ORAL_TABLET | Freq: Every day | ORAL | Status: DC
  Administered 2016-04-26 – 2016-04-28 (×3): 40 mg via ORAL
  Filled 2016-04-25 (×3): qty 1

## 2016-04-25 MED ORDER — DULOXETINE HCL 60 MG PO CPEP
60.0000 mg | ORAL_CAPSULE | Freq: Every day | ORAL | Status: DC
  Administered 2016-04-26 – 2016-04-30 (×5): 60 mg via ORAL
  Filled 2016-04-25 (×5): qty 1

## 2016-04-25 MED ORDER — ASPIRIN 325 MG PO TABS: 325.0000 mg | ORAL_TABLET | Freq: Every day | ORAL | Status: DC

## 2016-04-25 MED ORDER — DOXEPIN HCL 10 MG/ML PO CONC
3.0000 mg | Freq: Every day | ORAL | Status: DC
  Administered 2016-04-25 – 2016-04-29 (×5): 3 mg via ORAL
  Filled 2016-04-25 (×5): qty 0.3

## 2016-04-25 MED ORDER — ASPIRIN 325 MG PO TABS
325.0000 mg | ORAL_TABLET | Freq: Every day | ORAL | Status: DC
Start: 2016-04-26 — End: 2016-04-30
  Administered 2016-04-26 – 2016-04-30 (×5): 325 mg via ORAL
  Filled 2016-04-25 (×5): qty 1

## 2016-04-25 MED ORDER — TRAZODONE HCL 50 MG PO TABS
50.0000 mg | ORAL_TABLET | Freq: Every evening | ORAL | Status: DC | PRN
  Administered 2016-04-27: 50 mg via ORAL
  Filled 2016-04-25: qty 1

## 2016-04-25 MED ORDER — HYDROCHLOROTHIAZIDE 25 MG PO TABS
12.5000 mg | ORAL_TABLET | Freq: Every morning | ORAL | Status: DC
  Administered 2016-04-26 – 2016-04-28 (×3): 12.5 mg via ORAL
  Administered 2016-04-28: 08:00:00 via ORAL
  Administered 2016-04-29 – 2016-04-30 (×2): 12.5 mg via ORAL
  Filled 2016-04-25 (×5): qty 1

## 2016-04-25 NOTE — Evaluation (Signed)
Speech Language Pathology Evaluation Patient Details Name: Sharon Roberson MRN: [id number] DOB: 05/14/1966 Today's Date: 04/25/2016 Time: [id number] SLP Time Calculation (min) (ACUTE ONLY): 20 min  Problem List:  Patient Active Problem List   Diagnosis Date Noted  . Acute right-sided weakness 04/23/2016  . CVA (cerebral infarction) 04/23/2016  . Benign essential HTN 04/23/2016  . Neuropathy (HCC) 04/23/2016  . Dyslipidemia 04/23/2016  . Right sided weakness    Past Medical History:  Past Medical History  Diagnosis Date  . Hypertension   . Hypercholesteremia   . Sarcoidosis (HCC)   . Pneumothorax    Past Surgical History: History reviewed. No pertinent past surgical history. HPI:  50 year old female admitted 04/23/16 with dysarthria and right side weakness. PMH significant for HTN, Sarcoidosis, Pneumothorax. MRI revealed infarcts in the left pons and cingulate gyrus. Orders received for com/cog evaluation.    Assessment / Plan / Recommendation Clinical Impression  Pt presents with mild-moderate dysarthria due to lingual weakness and poor articulatory contacts. Her speech seems to be variable in terms of full clarity. Her speech is intelligible, but at times she omits hard articulatory contacts which decreases clarity. Verbal expression is functional during this assessment, however, pt's daughter reports occasional word finding difficulty in conversation which is frustrating for pt.  Pt exhibits functional auditory comprehension. Pt was encouraged to slow speech rate and overarticulate to improve intelligibility and communication effectiveness. [facility].     SLP Assessment  All further Speech Lanaguage Pathology  needs can be addressed in the next venue of care    Follow Up Recommendations    ST at CIR   Frequency and Duration   per CIR SLP        SLP Evaluation Prior Functioning  Cognitive/Linguistic  Baseline: Within functional limits Type of Home: House  Lives With: Significant other Available Help at Discharge: Family;Available 24 hours/day Education: high school education   Cognition  Not assessed at this time   Comprehension  Auditory Comprehension Overall Auditory Comprehension: Appears within functional limits for tasks assessed Yes/No Questions: Within Functional Limits Commands: Within Functional Limits EffectiveTechniques: Slowed speech    Expression Expression Primary Mode of Expression: Verbal Verbal Expression Overall Verbal Expression: Impaired Initiation: No impairment Level of Generative/Spontaneous Verbalization: Conversation;Sentence Repetition: No impairment Naming:  (high level word retrieval difficulty reported) Interfering Components: Speech intelligibility Non-Verbal Means of Communication: Not applicable Written Expression Dominant Hand: Right   Oral / Motor  Oral Motor/Sensory Function Overall Oral Motor/Sensory Function: Mild impairment Facial ROM: Within Functional Limits Facial Symmetry: Within Functional Limits Facial Strength: Within Functional Limits Facial Sensation: Within Functional Limits Lingual ROM: Within Functional Limits Lingual Symmetry: Within Functional Limits Lingual Strength: Reduced Lingual Sensation: Within Functional Limits Mandible: Within Functional Limits Motor Speech Overall Motor Speech: Impaired Respiration: Within functional limits Phonation: Normal Resonance: Within functional limits Articulation: Impaired Level of Impairment: Conversation Intelligibility: Intelligibility reduced Word: 75-100% accurate Phrase: 75-100% accurate Sentence: 75-100% accurate Conversation: 75-100% accurate Motor Speech Errors: Aware Effective Techniques: Slow rate;Over-articulate                 Leigh AuroraBueche, Celia Brown 04/25/2016, 2:03 PM  Celia B. Murvin NatalBueche, [facility] Surgery CenterMSP, CCC-SLP [phone]

## 2016-04-25 NOTE — Progress Notes (Signed)
Fae Pippin Rehab Admission Coordinator Signed Physical Medicine and Rehabilitation PMR Pre-admission 04/25/2016 1:58 PM  Related encounter: ED to Hosp-Admission (Current) from 04/23/2016 in [facility]M [facility]URO [facility]ICAL    Expand All Collapse All   PMR Admission Coordinator Pre-Admission Assessment  Patient: Sharon Roberson is an 50 y.o., female MRN: [id number] DOB: 15-Mar-1966 Height: 4\' 10"  (147.3 cm) Weight: 75.8 kg (167 lb 1.7 oz)  Insurance Information HMO: X PPO: PCP: IPA: 80/20: OTHER:  PRIMARY: BCBS Medicare Policy#: [id number] Subscriber: Self CM Name: Phone#: Fax#:  Pre-Cert#: Employer:  Benefits: Phone #: [phone] Name: [employer]  Eff. Date: 11/06/10 Deduct: $0 Out of Pocket Max: $6,700 Life Max: none CIR: $265 days 1-6; $0 days 7-190 SNF: $0 days 1-20; $164.50 days 21-100 Outpatient: PT/OT/SLP Co-Pay: $40 Home Health: PT/OT/SLP Co-Pay: $0 DME: 80% Co-Pay: 20% Providers: in network   SECONDARY: Medicaid [place] Access Policy#: [id number] Subscriber: Self CM Name: Phone#: Fax#:  Pre-Cert#: Eligible as of 04/25/16; Code: MADQN Employer:  Benefits: Phone #: [phone] Name:  Eff. 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  History of Present Illness: Sharon Roberson is a 50 y.o. right handed female with history of hypertension, hyperlipidemia, tobacco abuse. Per chart review patient lives with significant other versus friend. One level home. Independent prior to admission. Presented 04/23/2016 with slurred speech and right-sided weakness. Urine drug screen positive marijuana. Cranial CT scan negative. 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 No evidence of major occlusion or significant proximal stenosis per MRA. Patient did not receive TPA. Echocardiogram with ejection fraction of 65% grade 1 diastolic dysfunction. Carotid Dopplers with no ICA stenosis. Cardiology services consulted placed on aspirin for CVA prophylaxis. Tolerating a regular diet. Physical therapy evaluation completed. Patient was admitted for comprehensive rehabilitation program.  NIH Total: 1    Past Medical History  Past Medical History  Diagnosis Date  . Hypertension   . Hypercholesteremia   . Sarcoidosis (HCC)   . Pneumothorax     Family History  family history is not on file.  Prior Rehab/Hospitalizations:  Has the patient had major surgery during 100 days prior to admission? No  Current Medications   Current facility-administered medications:  . acetaminophen (TYLENOL) tablet 650 mg, 650 mg, Oral, Q6H PRN, Alison Murray, MD, 650 mg at 04/24/16 1842 . aspirin suppository 300 mg, 300 mg, Rectal, Daily **OR** aspirin tablet 325 mg, 325 mg, Oral, Daily, Alison Murray, MD, 325 mg at 04/25/16 0913 . atorvastatin (LIPITOR) tablet 40 mg, 40 mg, Oral, q1800, Alison Murray, MD, 40 mg at 04/24/16 1718 . doxepin (SINEQUAN) 10 MG/ML solution 3 mg, 3 mg, Oral, QHS, Alison Murray, MD, 3 mg at  04/24/16 2125 . DULoxetine (CYMBALTA) DR capsule 60 mg, 60 mg, Oral, Daily, Alison Murray, MD, 60 mg at 04/25/16 0914 . famotidine (PEPCID) tablet 40 mg, 40 mg, Oral, Daily, Gerome Apley [place], PA-C, 40 mg at 04/25/16 0913 . gabapentin (NEURONTIN) capsule 300 mg, 300 mg, Oral, QHS, Alma  Concepcion ElkM Devine, MD, 300 mg at 04/24/16 2125 . hydrochlorothiazide (HYDRODIURIL) tablet 12.5 mg, 12.5 mg, Oral, q morning - 10a, Alison MurrayAlma M Devine, MD, 12.5 mg at 04/25/16 0915 . nicotine (NICODERM CQ - dosed in mg/24 hours) patch 14 mg, 14 mg, Transdermal, Daily, Alison MurrayAlma M Devine, MD, 14 mg at 04/25/16 0916 . senna-docusate (Senokot-S) tablet 1 tablet, 1 tablet, Oral, QHS PRN, Alison MurrayAlma M Devine, MD  Patients Current Diet: Diet Heart Room service appropriate?: Yes; Fluid consistency:: Thin  Precautions / Restrictions Precautions Precautions: Fall Restrictions Weight Bearing Restrictions: No   Has the patient had 2 or more falls or a fall with injury in the past year?No  Prior Activity Level Community (5-7x/wk): Prior to admission patient did not work or drive; however, she was active around the house and out in the community daily.   Home Assistive Devices / Equipment Home Assistive Devices/Equipment: None Home Equipment: Grab bars - tub/shower  Prior Device Use: Indicate devices/aids used by the patient prior to current illness, exacerbation or injury? None of the above  Prior Functional Level Prior Function Level of Independence: Independent  Self Care: Did the patient need help bathing, dressing, using the toilet or eating? Independent  Indoor Mobility: Did the patient need assistance with walking from room to room (with or without device)? Independent  Stairs: Did the patient need assistance with internal or external stairs (with or without device)? Independent  Functional Cognition: Did the patient need help planning regular tasks such as shopping or remembering to take medications? Independent  Current  Functional Level Cognition  Overall Cognitive Status: Impaired/Different from baseline Current Attention Level: Sustained (easily internally distracted) Orientation Level: Oriented X4 Following Commands: Follows one step commands inconsistently (due to decr attention; distractible) Safety/Judgement: Decreased awareness of safety, Decreased awareness of deficits General Comments: Pt with better safety awareness and insight into deficits after gait training, "i am not walking like normal."   Extremity Assessment (includes Sensation/Coordination)  Upper Extremity Assessment: Defer to OT evaluation RUE Deficits / Details: grossly 3/5, reports sensation intact, decreased proprioception; observed to fed self with RUE with increased time and effort. dropping items in right hand at times. RUE Sensation: decreased proprioception RUE Coordination: decreased fine motor, decreased gross motor  Lower Extremity Assessment: RLE deficits/detail RLE Deficits / Details: Grossly ~2+/5 quadriceps, hip flexion, knee flexion. RLE Sensation: (Reports WFL; proprioception WFL.) RLE Coordination: decreased gross motor, decreased fine motor    ADLs  Overall ADL's : Needs assistance/impaired Eating/Feeding: Set up, Sitting Eating/Feeding Details (indicate cue type and reason): extra time and effort, dropping items in right hand at times Grooming: Minimal assistance, Sitting Upper Body Bathing: Moderate assistance, Sitting Lower Body Bathing: Moderate assistance, Sit to/from stand Upper Body Dressing : Moderate assistance, Sitting Lower Body Dressing: Moderate assistance, Sit to/from stand Toilet Transfer: Moderate assistance, Ambulation Toileting- Clothing Manipulation and Hygiene: Moderate assistance, Sit to/from stand Tub/ Shower Transfer: Moderate assistance, Ambulation, 3 in 1 Functional mobility during ADLs: Moderate assistance General ADL Comments: Pt completed in-room functional mobility around  bed to sit up in recliner. Decreased safety, decreased insight into deficits, and some impulsivity noted. LOB 3-4x needing mod A to prevent fall. Chair alarm set and educated family on having nursing or therapy staff with pt when walking.     Mobility  Overal bed mobility: Needs Assistance Bed Mobility: Supine to Sit, Sit to Supine Supine to sit: Min assist Sit to supine: Min guard General bed mobility comments: HOB flat, no rail; patient maneuvering linens; extra time  and effort; came to left side.     Transfers  Overall transfer level: Needs assistance Equipment used: 1 person hand held assist Transfers: Sit to/from Stand Sit to Stand: Min assist General transfer comment: from EOB; Rt knee unstable/weak with assist to prevent hyperextension; x2    Ambulation / Gait / Stairs / Wheelchair Mobility  Ambulation/Gait Ambulation/Gait assistance: Architect (Feet): 50 Feet Assistive device: 1 person hand held assist (hall rail) Gait Pattern/deviations: Step-through pattern, Decreased stride length, Decreased dorsiflexion - right, Decreased weight shift to right, Decreased weight shift to left, Shuffle, Staggering right (rt knee instability; hyperextension) General Gait Details: Pt with knee hyperextension thrust RLE during stance phase due to right knee instability. Initially pt could not feel this; with facilitation she began to feel and control Rt knee better; Fatigued (had taken a shower and been up in chair all morning). Right lateral trunk lean with decreased arm swing. Gait velocity: decreased Gait velocity interpretation: Below normal speed for age/gender    Posture / Balance Dynamic Sitting Balance Sitting balance - Comments: Sat EOB and donned socks. Right trunk weakness noted when donning left sock with pt leaning onto right side out of midline. Balance Overall balance assessment: Needs assistance Sitting-balance support: Feet supported, No upper  extremity supported Sitting balance-Leahy Scale: Fair Sitting balance - Comments: Sat EOB and donned socks. Right trunk weakness noted when donning left sock with pt leaning onto right side out of midline. Standing balance support: No upper extremity supported, During functional activity Standing balance-Leahy Scale: Poor Standing balance comment: min assist with imbalance    Special needs/care consideration BiPAP/CPAP: No CPM: No Continuous Drip IV: No Dialysis: No  Life Vest: No Oxygen: No Special Bed: No  Trach Size: No Wound Vac (area): No  Skin: WDL  Bowel mgmt: PTA on 04/23/16 Bladder mgmt: continent  Diabetic mgmt: N/A     Previous Home Environment Living Arrangements: Other (Comment) ("my man") Lives With: Significant other Available Help at Discharge: Family, Available 24 hours/day Type of Home: House Home Layout: One level Home Access: Stairs to enter Entrance Stairs-Rails: Left Entrance Stairs-Number of Steps: 3;2in the back Bathroom Shower/Tub: Tub/shower unit Home Care Services: No  Discharge Living Setting Plans for Discharge Living Setting: Patient's home Type of Home at Discharge: House Discharge Home Layout: One level Discharge Home Access: Stairs to enter Entrance Stairs-Rails: Left Entrance Stairs-Number of Steps: 2 Discharge Bathroom Shower/Tub: Tub/shower unit, Door Discharge Bathroom Toilet: Standard Discharge Bathroom Accessibility: Yes How Accessible: Accessible via walker Does the patient have any problems obtaining your medications?: No  Social/Family/Support Systems Patient Roles: Partner, Parent Contact Information: Daughter: Emi [place] [phone] Anticipated Caregiver: Boyfriend: Katina Dung [phone] Ability/Limitations of Caregiver: None Caregiver Availability: 24/7 Discharge Plan Discussed with Primary Caregiver: Yes Is Caregiver In Agreement with Plan?:  Yes Does Caregiver/Family have Issues with Lodging/Transportation while Pt is in Rehab?: No  Goals/Additional Needs Patient/Family Goal for Rehab: PT/OT/SLP Supervision-Mod I  Expected length of stay: 10-14 days  Cultural Considerations: None Dietary Needs: None Equipment Needs: TBD Special Service Needs: None Additional Information: None Pt/Family Agrees to Admission and willing to participate: Yes Program Orientation Provided & Reviewed with Pt/Caregiver Including Roles & Responsibilities: Yes Additional Information Needs: None Information Needs to be Provided By: N/A  Decrease burden of Care through IP rehab admission: No  Possible need for SNF placement upon discharge: No  Patient Condition: This patient's condition remains as documented in the consult dated 04/24/16, in which the Rehabilitation  Physician determined and documented that the patient's condition is appropriate for intensive rehabilitative care in an inpatient rehabilitation facility. Will admit to inpatient rehab today.  Preadmission Screen Completed By: Fae Pippin, 04/25/2016 2:10 PM ______________________________________________________________________  Discussed status with Dr. Riley Kill on 04/25/16 at 1500 and received telephone approval for admission today.  Admission Coordinator: Fae Pippin, time 1500/Date 04/25/16          Cosigned by: Ranelle Oyster, MD at 04/25/2016 3:26 PM  Revision History     Date/Time User Provider Type Action   04/25/2016 3:26 PM Ranelle Oyster, MD Physician Cosign   04/25/2016 3:24 PM Fae Pippin Rehab Admission Coordinator Sign

## 2016-04-25 NOTE — Discharge Summary (Signed)
Physician Discharge Summary  Sharon Roberson ZOX:[id number] DOB: [birth year] DOA: 04/23/2016  PCP: No primary care provider on file.  Admit date: 04/23/2016 Discharge date: 04/25/2016  Recommendations for Outpatient Follow-up:  1. Monitor CBC and BMP per rehab   Discharge Diagnoses:  Principal Problem:   Acute right-sided weakness Active Problems:   CVA (cerebral infarction)   Benign essential HTN   Neuropathy (HCC)   Dyslipidemia    Discharge Condition: stable   Diet recommendation: as tolerated   History of present illness:  50 y.o. female with medical history significant for hypertension, neuropathy, dyslipidemia who presented to [facility] with new onset right-sided weakness when she woke up this morning. Patient reported she called her daughter who noticed that her mom has slurred speech and she advised her to go to ED for evaluation.  Pt was hemodynamically stable in ED. Stroke code called but cancelled subsequently. Pt outside of the treatment window. Her blood work was essentially unremarkable. CT head showed no acute intracranial findings. MRI showed acute CVA in pons and left cingulate gyrus.  Hospital Course:    Assessment & Plan:  Principal Problem:  Acute right-sided weakness / CVA (cerebral infarction), pons and left cingulate gyrus - Stroke work up initiated:  - Aspirin daily - MRI brain / MRA brain - small acute infarcts in the pons and left cingulate gyrus. Moderately advanced cerebral white matter disease for age, nonspecific. Considerations include chronic small vessel ischemia, neurosarcoidosis, other vasculitis, and demyelination. No evidence of dural or leptomeningeal sarcoid involvement. No evidence of major branch occlusion or significant proximal stenosis.  - CT head - no acute intracranial findings  - 2D ECHO - EF 60%, grade 1 diastolic dysfunction  - Carotid doppler - pending  - HgbA1c, Lipid panel - 147. LDL goal < 100. Patient on zocor 80 mg a  day - UDS positive for THC - Diet: passed swallow screen - Therapy: PT/OT - CIR recommended  Active Problems:  Benign essential HTN - Continue Hctz   Neuropathy (HCC) - Continue gabapentin   Dyslipidemia - Continue statin therapy    Tobacco use disorder / THC in urine drug screen - Nicotine patch ordered - Counseled on cessation and drug abuse     DVT prophylaxis: SCD's bilaterally  Code Status: full code  Family Communication: daughter at the bedside this am    Consultants:   Neurology  Procedures:   ECHO 04/24/2016 - EF 60%, grade 1 diastolic dysfunction   Carotid doppler  Antimicrobials:   None   Signed:  Manson Passey, MD  [facility] 04/25/2016, 4:42 PM  Pager #: [phone]  Time spent in minutes: more than 30 minutes    Discharge Exam: Filed Vitals:   04/25/16 1011 04/25/16 1342  BP: 133/92 138/86  Pulse: 86 84  Temp: 98.3 F (36.8 C) 97.9 F (36.6 C)  Resp: 22 22   Filed Vitals:   04/25/16 0108 04/25/16 0557 04/25/16 1011 04/25/16 1342  BP: 147/77 127/73 133/92 138/86  Pulse: 76 103 86 84  Temp: 98 F (36.7 C) 98.2 F (36.8 C) 98.3 F (36.8 C) 97.9 F (36.6 C)  TempSrc: Oral Oral Oral Oral  Resp: 20 20 22 22   Height:      Weight:      SpO2: 100% 100% 99% 100%    General: Pt is alert, follows commands appropriately, not in acute distress Cardiovascular: Regular rate and rhythm, S1/S2 +, no murmurs Respiratory: Clear to auscultation bilaterally, no wheezing, no crackles, no rhonchi Abdominal:  Soft, non tender, non distended, bowel sounds +, no guarding Extremities: no edema, no cyanosis, pulses palpable bilaterally DP and PT Neuro: Slurred speech   Discharge Instructions  Discharge Instructions    Ambulatory referral to Neurology    Complete by:  As directed   [facility]. Thanks.     Call MD for:  difficulty breathing, headache or visual disturbances     Complete by:  As directed      Call MD for:  persistant dizziness or light-headedness    Complete by:  As directed      Call MD for:  persistant nausea and vomiting    Complete by:  As directed      Call MD for:  severe uncontrolled pain    Complete by:  As directed      Diet - low sodium heart healthy    Complete by:  As directed      Increase activity slowly    Complete by:  As directed             Medication List    STOP taking these medications        ibuprofen 200 MG tablet  Commonly known as:  ADVIL,MOTRIN      TAKE these medications        aspirin 325 MG tablet  Take 1 tablet (325 mg total) by mouth daily.     DULoxetine 60 MG capsule  Commonly known as:  CYMBALTA  Take 60 mg by mouth daily.     famotidine 40 MG tablet  Commonly known as:  PEPCID  Take 1 tablet (40 mg total) by mouth daily.     FLONASE 50 MCG/ACT nasal spray  Generic drug:  fluticasone  Place 1-2 sprays into both nostrils daily.     gabapentin 300 MG capsule  Commonly known as:  NEURONTIN  Take 300 mg by mouth at bedtime.     hydrochlorothiazide 12.5 MG tablet  Commonly known as:  HYDRODIURIL  Take 12.5 mg by mouth every morning.     methocarbamol 500 MG tablet  Commonly known as:  ROBAXIN  Take 500 mg by mouth every 4 (four) hours as needed for muscle spasms.     nicotine 14 mg/24hr patch  Commonly known as:  NICODERM CQ - dosed in mg/24 hours  Place 1 patch (14 mg total) onto the skin daily.     senna-docusate 8.6-50 MG tablet  Commonly known as:  Senokot-S  Take 1 tablet by mouth at bedtime as needed for mild constipation.     SILENOR 3 MG Tabs  Generic drug:  Doxepin HCl  Take 3 mg by mouth daily.     simvastatin 80 MG tablet  Commonly known as:  ZOCOR  Take 80 mg by mouth at bedtime.           Follow-up Information    Follow up with Nilda RiggsMARTIN,NANCY CAROLYN, NP. Schedule an appointment as soon as possible for a visit in 2 months.   Specialty:  Family Medicine   Why:   stroke clinic   Contact information:   [address] [place] [address] [phone]        The results of significant diagnostics from this hospitalization (including imaging, microbiology, ancillary and laboratory) are listed below for reference.    Significant Diagnostic Studies: Ct Head Wo Contrast  04/23/2016  CLINICAL DATA:  Code stroke. EXAM: CT HEAD WITHOUT CONTRAST  TECHNIQUE: Contiguous axial images were obtained from the base of the skull through the vertex without intravenous contrast. COMPARISON:  None. FINDINGS: There is no evidence for acute hemorrhage, hydrocephalus, mass lesion, or abnormal extra-axial fluid collection. No definite CT evidence for acute infarction. The visualized paranasal sinuses and mastoid air cells are clear. IMPRESSION: Normal CT evaluation of the brain. Critical Value/emergent results were called by telephone at the time of interpretation on 04/23/2016 at 4:55 pm to April, working with Dr. Amada Jupiter, who verbally acknowledged these results. Electronically Signed   By: Kennith Center M.D.   On: 04/23/2016 16:56   Mr Laqueta Jean ZO Contrast  04/24/2016  CLINICAL DATA:  New onset right-sided weakness. Slurred speech. History of hypertension, hypercholesterolemia, and sarcoidosis. EXAM: MRI HEAD WITHOUT AND WITH CONTRAST MRA HEAD WITHOUT CONTRAST TECHNIQUE: Multiplanar, multiecho pulse sequences of the brain and surrounding structures were obtained without and with intravenous contrast. Angiographic images of the head were obtained using MRA technique without contrast. CONTRAST:  15mL MULTIHANCE GADOBENATE DIMEGLUMINE 529 MG/ML IV SOLN COMPARISON:  Head CT 04/23/2016 FINDINGS: MRI HEAD FINDINGS There is a 9 mm acute infarct in the left cingulate gyrus. There is also a 1.2 cm acute infarct in the left paramedian pons. There is no evidence of intracranial hemorrhage, mass, midline shift, or extra-axial fluid collection. The ventricles and sulci are normal.  Patchy T2 hyperintensities are present in the predominantly deep cerebral white matter bilaterally and are moderately advanced for age. No abnormal brain parenchymal or meningeal enhancement is identified. Orbits are unremarkable. No significant inflammatory disease is seen in the paranasal sinuses are mastoid air cells. Major intracranial vascular flow voids are preserved. MRA HEAD FINDINGS There is mild motion artifact. The visualized distal vertebral arteries are patent with the left being minimally larger than the right. Left PICA origin is patent. The right PICA origin was not imaged. SCA origins are patent. Basilar artery is widely patent. Posterior communicating arteries are not identified. PCAs are patent without evidence of significant proximal stenosis. The internal carotid arteries are patent from skullbase to carotid termini. Focal areas diminished signal the in the proximal right petrous ICA is favored to be artifactual, with less prominent artifact on the contralateral side at the same level. The ACAs and MCAs are patent without evidence of major branch occlusion or significant proximal stenosis. No intracranial aneurysm is identified. IMPRESSION: 1. 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 2. Moderately advanced cerebral white matter disease for age, nonspecific. Considerations include chronic small vessel ischemia, neurosarcoidosis, other vasculitis, and demyelination. No evidence of dural or leptomeningeal sarcoid involvement. 3. No evidence of major branch occlusion or significant proximal stenosis. Electronically Signed   By: Sebastian Ache M.D.   On: 04/24/2016 09:51   Mr Maxine Glenn Head/brain Wo Cm  04/24/2016  CLINICAL DATA:  New onset right-sided weakness. Slurred speech. History of hypertension, hypercholesterolemia, and sarcoidosis. EXAM: MRI HEAD WITHOUT AND WITH CONTRAST MRA HEAD WITHOUT CONTRAST TECHNIQUE: Multiplanar, multiecho pulse sequences of the brain and surrounding  structures were obtained without and with intravenous contrast. Angiographic images of the head were obtained using MRA technique without contrast. CONTRAST:  15mL MULTIHANCE GADOBENATE DIMEGLUMINE 529 MG/ML IV SOLN COMPARISON:  Head CT 04/23/2016 FINDINGS: MRI HEAD FINDINGS There is a 9 mm acute infarct in the left cingulate gyrus. There is also a 1.2 cm acute infarct in the left paramedian pons. There is no evidence of intracranial hemorrhage, mass, midline shift, or extra-axial fluid collection. The ventricles and  sulci are normal. Patchy T2 hyperintensities are present in the predominantly deep cerebral white matter bilaterally and are moderately advanced for age. No abnormal brain parenchymal or meningeal enhancement is identified. Orbits are unremarkable. No significant inflammatory disease is seen in the paranasal sinuses are mastoid air cells. Major intracranial vascular flow voids are preserved. MRA HEAD FINDINGS There is mild motion artifact. The visualized distal vertebral arteries are patent with the left being minimally larger than the right. Left PICA origin is patent. The right PICA origin was not imaged. SCA origins are patent. Basilar artery is widely patent. Posterior communicating arteries are not identified. PCAs are patent without evidence of significant proximal stenosis. The internal carotid arteries are patent from skullbase to carotid termini. Focal areas diminished signal the in the proximal right petrous ICA is favored to be artifactual, with less prominent artifact on the contralateral side at the same level. The ACAs and MCAs are patent without evidence of major branch occlusion or significant proximal stenosis. No intracranial aneurysm is identified. IMPRESSION: 1. 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 2. Moderately advanced cerebral white matter disease for age, nonspecific. Considerations include chronic small vessel ischemia, neurosarcoidosis, other vasculitis,  and demyelination. No evidence of dural or leptomeningeal sarcoid involvement. 3. No evidence of major branch occlusion or significant proximal stenosis. Electronically Signed   By: Sebastian Ache M.D.   On: 04/24/2016 09:51    Microbiology: No results found for this or any previous visit (from the past 240 hour(s)).   Labs: Basic Metabolic Panel:  Recent Labs Lab 04/23/16 1638 04/23/16 1650  NA 139 140  K 3.8 3.8  CL 105 105  CO2 26  --   GLUCOSE 73 69  BUN 10 10  CREATININE 0.73 0.70  CALCIUM 9.8  --    Liver Function Tests:  Recent Labs Lab 04/23/16 1638  AST 24  ALT 18  ALKPHOS 83  BILITOT 0.6  PROT 8.0  ALBUMIN 4.1   No results for input(s): LIPASE, AMYLASE in the last 168 hours. No results for input(s): AMMONIA in the last 168 hours. CBC:  Recent Labs Lab 04/23/16 1638 04/23/16 1650  WBC 7.0  --   NEUTROABS 3.7  --   HGB 14.6 15.3*  HCT 42.8 45.0  MCV 91.8  --   PLT 207  --    Cardiac Enzymes: No results for input(s): CKTOTAL, CKMB, CKMBINDEX, TROPONINI in the last 168 hours. BNP: BNP (last 3 results) No results for input(s): BNP in the last 8760 hours.  ProBNP (last 3 results) No results for input(s): PROBNP in the last 8760 hours.  CBG: No results for input(s): GLUCAP in the last 168 hours.

## 2016-04-25 NOTE — H&P (Signed)
Physical Medicine and Rehabilitation Admission H&P    Chief complaint:weakness  HPI: Sharon Roberson is a 50 y.o. right handed female with history of hypertension, hyperlipidemia, tobacco abuse. Per chart review patient lives with significant other versus friend. One level home. Independent prior to admission. Presented 04/23/2016 with slurred speech and right-sided weakness. Urine drug screen positive marijuana. Cranial CT scan negative. 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 No evidence of major occlusion or significant proximal stenosis per MRA. Patient did not receive TPA. Echocardiogram with ejection fraction of 65% grade 1 diastolic dysfunction. Carotid Dopplers with no ICA stenosis. Cardiology services consulted placed on aspirin for CVA prophylaxis. Tolerating a regular diet. Physical therapy evaluation completed. Patient was admitted for comprehensive rehabilitation program  ROS Constitutional: Negative for fever and chills.  HENT: Negative for hearing loss.   Occasional headache  Eyes: Negative for blurred vision and double vision.  Respiratory: Positive for cough. Negative for shortness of breath.  Cardiovascular: Negative for chest pain, palpitations and leg swelling.  Gastrointestinal: Positive for constipation. Negative for nausea and vomiting.  Genitourinary: Negative for dysuria and hematuria.  Musculoskeletal: Positive for myalgias.  Skin: Negative for rash.  Psychiatric. Depression Neurological: Positive for weakness. Negative for seizures.  All other systems reviewed and are negative   Past Medical History  Diagnosis Date  . Hypertension   . Hypercholesteremia   . Sarcoidosis (HCC)   . Pneumothorax    History reviewed. No pertinent past surgical history. No family history on file. Social History:  reports that she has been smoking Cigarettes.  She has been smoking about 0.50 packs per day. She does not have any smokeless  tobacco history on file. She reports that she drinks alcohol. She reports that she does not use illicit drugs. Allergies:  Allergies  Allergen Reactions  . Penicillins Anaphylaxis    Has patient had a PCN reaction causing immediate rash, facial/tongue/throat swelling, SOB or lightheadedness with hypotension: Yes Has patient had a PCN reaction causing severe rash involving mucus membranes or skin necrosis: No Has patient had a PCN reaction that required hospitalization Yes Has patient had a PCN reaction occurring within the last 10 years: Yes If all of the above answers are "NO", then may proceed with Cephalosporin use.   . Sulfamethoxazole Anaphylaxis   Medications Prior to Admission  Medication Sig Dispense Refill  . Doxepin HCl (SILENOR) 3 MG TABS Take 3 mg by mouth daily.    . DULoxetine (CYMBALTA) 60 MG capsule Take 60 mg by mouth daily.    . fluticasone (FLONASE) 50 MCG/ACT nasal spray Place 1-2 sprays into both nostrils daily.    Marland Kitchen gabapentin (NEURONTIN) 300 MG capsule Take 300 mg by mouth at bedtime.    . hydrochlorothiazide (HYDRODIURIL) 12.5 MG tablet Take 12.5 mg by mouth every morning.    Marland Kitchen ibuprofen (ADVIL,MOTRIN) 200 MG tablet Take 800 mg by mouth every 6 (six) hours as needed for mild pain or moderate pain.    . methocarbamol (ROBAXIN) 500 MG tablet Take 500 mg by mouth every 4 (four) hours as needed for muscle spasms.    . simvastatin (ZOCOR) 80 MG tablet Take 80 mg by mouth at bedtime.      Home: Home Living Family/patient expects to be discharged to:: Private residence Living Arrangements: Other (Comment) ("my man") Available Help at Discharge: Family, Available 24 hours/day Type of Home: House Home Access: Stairs to enter [employer] of Steps: 3;2in the back Entrance Stairs-Rails:  Left Home Layout: One level Bathroom Shower/Tub: Tub/shower unit Home Equipment: Grab bars - tub/shower  Lives With: Significant other   Functional History: Prior  Function Level of Independence: Independent  Functional Status:  Mobility: Bed Mobility Overal bed mobility: Needs Assistance Bed Mobility: Supine to Sit, Sit to Supine Supine to sit: Min assist Sit to supine: Min guard General bed mobility comments: HOB flat, no rail; patient maneuvering linens; extra time and effort; came to left side.  Transfers Overall transfer level: Needs assistance Equipment used: 1 person hand held assist Transfers: Sit to/from Stand Sit to Stand: Min assist General transfer comment: from EOB; Rt knee unstable/weak with assist to prevent hyperextension; x2 Ambulation/Gait Ambulation/Gait assistance: Min assist Ambulation Distance (Feet): 50 Feet Assistive device: 1 person hand held assist (hall rail) Gait Pattern/deviations: Step-through pattern, Decreased stride length, Decreased dorsiflexion - right, Decreased weight shift to right, Decreased weight shift to left, Shuffle, Staggering right (rt knee instability; hyperextension) General Gait Details: Pt with knee hyperextension thrust RLE during stance phase due to right knee instability. Initially pt could not feel this; with facilitation she began to feel and control Rt knee better; Fatigued (had taken a shower and been up in chair all morning). Right lateral trunk lean with decreased arm swing. Gait velocity: decreased Gait velocity interpretation: Below normal speed for age/gender    ADL: ADL Overall ADL's : Needs assistance/impaired Eating/Feeding: Set up, Sitting Eating/Feeding Details (indicate cue type and reason): extra time and effort, dropping items in right hand at times Grooming: Minimal assistance, Sitting Upper Body Bathing: Moderate assistance, Sitting Lower Body Bathing: Moderate assistance, Sit to/from stand Upper Body Dressing : Moderate assistance, Sitting Lower Body Dressing: Moderate assistance, Sit to/from stand Toilet Transfer: Moderate assistance, Ambulation Toileting- Clothing  Manipulation and Hygiene: Moderate assistance, Sit to/from stand Tub/ Shower Transfer: Moderate assistance, Ambulation, 3 in 1 Functional mobility during ADLs: Moderate assistance General ADL Comments: Pt completed in-room functional mobility around bed to sit up in recliner. Decreased safety, decreased insight into deficits, and some impulsivity noted. LOB 3-4x needing mod A to prevent fall. Chair alarm set and educated family on having nursing or therapy staff with pt when walking.   Cognition: Cognition Overall Cognitive Status: Impaired/Different from baseline Orientation Level: Oriented X4 Cognition Arousal/Alertness: Awake/alert (tired, just got back to bed) Behavior During Therapy: Flat affect, Impulsive Overall Cognitive Status: Impaired/Different from baseline Area of Impairment: Attention, Following commands, Safety/judgement, Problem solving, Awareness Current Attention Level: Sustained (easily internally distracted) Memory: Decreased recall of precautions Following Commands: Follows one step commands inconsistently (due to decr attention; distractible) Safety/Judgement: Decreased awareness of safety, Decreased awareness of deficits Awareness: Intellectual Problem Solving: Difficulty sequencing, Slow processing, Requires verbal cues General Comments: Pt with better safety awareness and insight into deficits after gait training, "i am not walking like normal."  Physical Exam: Blood pressure 138/86, pulse 84, temperature 97.9 F (36.6 C), temperature source Oral, resp. rate 22, height 4\' 10"  (1.473 m), weight 75.8 kg (167 lb 1.7 oz), SpO2 100 %. Physical Exam Constitutional: alert sitting in bed. Family present.  HENT: oral mucosa pink and moist Head: Normocephalic.  Eyes: EOM are normal.  Neck: Normal range of motion. Neck supple. No thyromegaly present.  Cardiovascular: Normal rate and regular rhythm.  Respiratory: Effort normal and breath sounds normal. No respiratory  distress.  GI: Soft. Bowel sounds are normal. She exhibits no distension.  Neurological: She is alert and oriented to person, place, and time.  Follows full commands. Fair awareness of  deficits. Normal language. Reasonable memory and insight. Able to carry conversation  Motor strength is 3+ to 4- in the right deltoid, biceps, triceps, grip. 3 to 3+/5 hip flexor, knee extensor, 1-2/5 right ankle dorsiflexor and plantar flexor Left side is 5/5 strength. Sensation intact to light touch in bilateral upper and lower limbs. No evidence of abnormal tone. Sensory exam normal. No evidence of dysmetria Skin: intact Psych: pleasant and cooperative  Results for orders placed or performed during the hospital encounter of 04/23/16 (from the past 48 hour(s))  I-stat troponin, ED (not at [facility], [facility])     Status: None   Collection Time: 04/23/16  4:48 PM  Result Value Ref Range   Troponin i, poc 0.00 0.00 - 0.08 ng/mL   Comment 3            Comment: Due to the release kinetics of cTnI, a negative result within the first hours of the onset of symptoms does not rule out myocardial infarction with certainty. If myocardial infarction is still suspected, repeat the test at appropriate intervals.   I-Stat Chem 8, ED  (not at [facility], [facility])     Status: Abnormal   Collection Time: 04/23/16  4:50 PM  Result Value Ref Range   Sodium 140 135 - 145 mmol/L   Potassium 3.8 3.5 - 5.1 mmol/L   Chloride 105 101 - 111 mmol/L   BUN 10 6 - 20 mg/dL   Creatinine, Ser 1.61 0.44 - 1.00 mg/dL   Glucose, Bld 69 65 - 99 mg/dL   Calcium, Ion 0.96 (L) 1.13 - 1.30 mmol/L   TCO2 23 0 - 100 mmol/L   Hemoglobin 15.3 (H) 12.0 - 15.0 g/dL   HCT 04.5 40.9 - 81.1 %  Lipid panel     Status: Abnormal   Collection Time: 04/24/16  2:28 AM  Result Value Ref Range   Cholesterol 251 (H) 0 - 200 mg/dL   Triglycerides 914 (H) <150 mg/dL   HDL 54 >78 mg/dL   Total CHOL/HDL Ratio 4.6 RATIO   VLDL 50 (H) 0 - 40 mg/dL   LDL Cholesterol 295  (H) 0 - 99 mg/dL    Comment:        Total Cholesterol/HDL:CHD Risk Coronary Heart Disease Risk Table                     Men   Women  1/2 Average Risk   3.4   3.3  Average Risk       5.0   4.4  2 X Average Risk   9.6   7.1  3 X Average Risk  23.4   11.0        Use the calculated Patient Ratio above and the CHD Risk Table to determine the patient's CHD Risk.        ATP III CLASSIFICATION (LDL):  <100     mg/dL   Optimal  621-308  mg/dL   Near or Above                    Optimal  130-159  mg/dL   Borderline  657-846  mg/dL   High  >962     mg/dL   Very High   Urine rapid drug screen (hosp performed)not at [facility]     Status: Abnormal   Collection Time: 04/24/16  4:23 AM  Result Value Ref Range   Opiates NONE DETECTED NONE DETECTED   Cocaine NONE DETECTED NONE DETECTED  Benzodiazepines NONE DETECTED NONE DETECTED   Amphetamines NONE DETECTED NONE DETECTED   Tetrahydrocannabinol POSITIVE (A) NONE DETECTED   Barbiturates NONE DETECTED NONE DETECTED    Comment:        DRUG SCREEN FOR MEDICAL PURPOSES ONLY.  IF CONFIRMATION IS NEEDED FOR ANY PURPOSE, NOTIFY LAB WITHIN 5 DAYS.        LOWEST DETECTABLE LIMITS FOR URINE DRUG SCREEN Drug Class       Cutoff (ng/mL) Amphetamine      1000 Barbiturate      200 Benzodiazepine   200 Tricyclics       300 Opiates          300 Cocaine          300 THC              50   Urinalysis, Routine w reflex microscopic (not at [facility])     Status: Abnormal   Collection Time: 04/24/16  4:23 AM  Result Value Ref Range   Color, Urine YELLOW YELLOW   APPearance CLOUDY (A) CLEAR   Specific Gravity, Urine 1.016 1.005 - 1.030   pH 6.5 5.0 - 8.0   Glucose, UA NEGATIVE NEGATIVE mg/dL   Hgb urine dipstick NEGATIVE NEGATIVE   Bilirubin Urine NEGATIVE NEGATIVE   Ketones, ur NEGATIVE NEGATIVE mg/dL   Protein, ur NEGATIVE NEGATIVE mg/dL   Nitrite NEGATIVE NEGATIVE   Leukocytes, UA TRACE (A) NEGATIVE  Urine microscopic-add on     Status: Abnormal    Collection Time: 04/24/16  4:23 AM  Result Value Ref Range   Squamous Epithelial / LPF 0-5 (A) NONE SEEN   WBC, UA 0-5 0 - 5 WBC/hpf   RBC / HPF NONE SEEN 0 - 5 RBC/hpf   Bacteria, UA RARE (A) NONE SEEN   Urine-Other AMORPHOUS URATES/PHOSPHATES    Ct Head Wo Contrast  04/23/2016  CLINICAL DATA:  Code stroke. EXAM: CT HEAD WITHOUT CONTRAST TECHNIQUE: Contiguous axial images were obtained from the base of the skull through the vertex without intravenous contrast. COMPARISON:  None. FINDINGS: There is no evidence for acute hemorrhage, hydrocephalus, mass lesion, or abnormal extra-axial fluid collection. No definite CT evidence for acute infarction. The visualized paranasal sinuses and mastoid air cells are clear. IMPRESSION: Normal CT evaluation of the brain. Critical Value/emergent results were called by telephone at the time of interpretation on 04/23/2016 at 4:55 pm to April, working with Dr. Amada Jupiter, who verbally acknowledged these results. Electronically Signed   By: Kennith Center M.D.   On: 04/23/2016 16:56   Mr Laqueta Jean ZO Contrast  04/24/2016  CLINICAL DATA:  New onset right-sided weakness. Slurred speech. History of hypertension, hypercholesterolemia, and sarcoidosis. EXAM: MRI HEAD WITHOUT AND WITH CONTRAST MRA HEAD WITHOUT CONTRAST TECHNIQUE: Multiplanar, multiecho pulse sequences of the brain and surrounding structures were obtained without and with intravenous contrast. Angiographic images of the head were obtained using MRA technique without contrast. CONTRAST:  15mL MULTIHANCE GADOBENATE DIMEGLUMINE 529 MG/ML IV SOLN COMPARISON:  Head CT 04/23/2016 FINDINGS: MRI HEAD FINDINGS There is a 9 mm acute infarct in the left cingulate gyrus. There is also a 1.2 cm acute infarct in the left paramedian pons. There is no evidence of intracranial hemorrhage, mass, midline shift, or extra-axial fluid collection. The ventricles and sulci are normal. Patchy T2 hyperintensities are present in the  predominantly deep cerebral white matter bilaterally and are moderately advanced for age. No abnormal brain parenchymal or meningeal enhancement is identified. Orbits are unremarkable. No significant  inflammatory disease is seen in the paranasal sinuses are mastoid air cells. Major intracranial vascular flow voids are preserved. MRA HEAD FINDINGS There is mild motion artifact. The visualized distal vertebral arteries are patent with the left being minimally larger than the right. Left PICA origin is patent. The right PICA origin was not imaged. SCA origins are patent. Basilar artery is widely patent. Posterior communicating arteries are not identified. PCAs are patent without evidence of significant proximal stenosis. The internal carotid arteries are patent from skullbase to carotid termini. Focal areas diminished signal the in the proximal right petrous ICA is favored to be artifactual, with less prominent artifact on the contralateral side at the same level. The ACAs and MCAs are patent without evidence of major branch occlusion or significant proximal stenosis. No intracranial aneurysm is identified. IMPRESSION: 1. 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 2. Moderately advanced cerebral white matter disease for age, nonspecific. Considerations include chronic small vessel ischemia, neurosarcoidosis, other vasculitis, and demyelination. No evidence of dural or leptomeningeal sarcoid involvement. 3. No evidence of major branch occlusion or significant proximal stenosis. Electronically Signed   By: Sebastian Ache M.D.   On: 04/24/2016 09:51   Mr Maxine Glenn Head/brain Wo Cm  04/24/2016  CLINICAL DATA:  New onset right-sided weakness. Slurred speech. History of hypertension, hypercholesterolemia, and sarcoidosis. EXAM: MRI HEAD WITHOUT AND WITH CONTRAST MRA HEAD WITHOUT CONTRAST TECHNIQUE: Multiplanar, multiecho pulse sequences of the brain and surrounding structures were obtained without and with  intravenous contrast. Angiographic images of the head were obtained using MRA technique without contrast. CONTRAST:  15mL MULTIHANCE GADOBENATE DIMEGLUMINE 529 MG/ML IV SOLN COMPARISON:  Head CT 04/23/2016 FINDINGS: MRI HEAD FINDINGS There is a 9 mm acute infarct in the left cingulate gyrus. There is also a 1.2 cm acute infarct in the left paramedian pons. There is no evidence of intracranial hemorrhage, mass, midline shift, or extra-axial fluid collection. The ventricles and sulci are normal. Patchy T2 hyperintensities are present in the predominantly deep cerebral white matter bilaterally and are moderately advanced for age. No abnormal brain parenchymal or meningeal enhancement is identified. Orbits are unremarkable. No significant inflammatory disease is seen in the paranasal sinuses are mastoid air cells. Major intracranial vascular flow voids are preserved. MRA HEAD FINDINGS There is mild motion artifact. The visualized distal vertebral arteries are patent with the left being minimally larger than the right. Left PICA origin is patent. The right PICA origin was not imaged. SCA origins are patent. Basilar artery is widely patent. Posterior communicating arteries are not identified. PCAs are patent without evidence of significant proximal stenosis. The internal carotid arteries are patent from skullbase to carotid termini. Focal areas diminished signal the in the proximal right petrous ICA is favored to be artifactual, with less prominent artifact on the contralateral side at the same level. The ACAs and MCAs are patent without evidence of major branch occlusion or significant proximal stenosis. No intracranial aneurysm is identified. IMPRESSION: 1. 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 2. Moderately advanced cerebral white matter disease for age, nonspecific. Considerations include chronic small vessel ischemia, neurosarcoidosis, other vasculitis, and demyelination. No evidence of dural or  leptomeningeal sarcoid involvement. 3. No evidence of major branch occlusion or significant proximal stenosis. Electronically Signed   By: Sebastian Ache M.D.   On: 04/24/2016 09:51       Medical Problem List and Plan: 1.  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  DVT Prophylaxis/Anticoagulation: SCDs. Monitor for any signs of DVT 3. Pain Management: Neurontin 300 mg daily at bedtime 4. Mood. Sinequan 10 mg daily at bedtime, Cymbalta 60 mg daily. 5. Neuropsych: This patient is capable of making decisions on his own behalf. 6. Skin/Wound Care: Routine skin checks 7. Fluids/Electrolytes/Nutrition: Routine I&O with follow-up chemistries upon admit 8. Hypertension. Hydrochlorothiazide 12.5 mg daily. Monitor increased mobility 9. Hyperlipidemia. Lipitor 10. Tobacco abuse/marijuana. Nicoderm patch. Provide counseling    Post Admission Physician Evaluation: 1. Functional deficits secondary  to left pontine/cingulate cyrus infarct. 2. Patient is admitted to receive collaborative, interdisciplinary care between the physiatrist, rehab nursing staff, and therapy team. 3. Patient's level of medical complexity and substantial therapy needs in context of that medical necessity cannot be provided at a lesser intensity of care such as a SNF. 4. Patient has experienced substantial functional loss from his/her baseline which was documented above under the "Functional History" and "Functional Status" headings.  Judging by the patient's diagnosis, physical exam, and functional history, the patient has potential for functional progress which will result in measurable gains while on inpatient rehab.  These gains will be of substantial and practical use upon discharge  in facilitating mobility and self-care at the household level. 5. Physiatrist will provide 24 hour management of medical needs as well as oversight of the therapy plan/treatment and  provide guidance as appropriate regarding the interaction of the two. 6. 24 hour rehab nursing will assist with bladder management, bowel management, safety, skin/wound care, disease management, medication administration, pain management and patient education  and help integrate therapy concepts, techniques,education, etc. 7. PT will assess and treat for/with: Lower extremity strength, range of motion, stamina, balance, functional mobility, safety, adaptive techniques and equipment, NMR, family education, ego support.   Goals are: mod I. 8. OT will assess and treat for/with: ADL's, functional mobility, safety, upper extremity strength, adaptive techniques and equipment, NMR, community reintegration, ego support, stroke education.   Goals are: mod I. Therapy may proceed with showering this patient. 9. SLP will assess and treat for/with: speech, communication, swallowing.  Goals are: mod I. 10. Case Management and Social Worker will assess and treat for psychological issues and discharge planning. 11. Team conference will be held weekly to assess progress toward goals and to determine barriers to discharge. 12. Patient will receive at least 3 hours of therapy per day at least 5 days per week. 13. ELOS: 7-10 days       14. Prognosis:  excellent     Ranelle Oyster, MD, [facility] Physical Medicine & Rehabilitation 04/25/2016   04/25/2016

## 2016-04-25 NOTE — H&P (View-Only) (Signed)
Physical Medicine and Rehabilitation Admission H&P    Chief complaint:weakness  HPI: Sharon Roberson is a 50 y.o. right handed female with history of hypertension, hyperlipidemia, tobacco abuse. Per chart review patient lives with significant other versus friend. One level home. Independent prior to admission. Presented 04/23/2016 with slurred speech and right-sided weakness. Urine drug screen positive marijuana. Cranial CT scan negative. 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 No evidence of major occlusion or significant proximal stenosis per MRA. Patient did not receive TPA. Echocardiogram with ejection fraction of 65% grade 1 diastolic dysfunction. Carotid Dopplers with no ICA stenosis. Cardiology services consulted placed on aspirin for CVA prophylaxis. Tolerating a regular diet. Physical therapy evaluation completed. Patient was admitted for comprehensive rehabilitation program  ROS Constitutional: Negative for fever and chills.  HENT: Negative for hearing loss.   Occasional headache  Eyes: Negative for blurred vision and double vision.  Respiratory: Positive for cough. Negative for shortness of breath.  Cardiovascular: Negative for chest pain, palpitations and leg swelling.  Gastrointestinal: Positive for constipation. Negative for nausea and vomiting.  Genitourinary: Negative for dysuria and hematuria.  Musculoskeletal: Positive for myalgias.  Skin: Negative for rash.  Psychiatric. Depression Neurological: Positive for weakness. Negative for seizures.  All other systems reviewed and are negative   Past Medical History  Diagnosis Date  . Hypertension   . Hypercholesteremia   . Sarcoidosis (HCC)   . Pneumothorax    History reviewed. No pertinent past surgical history. No family history on file. Social History:  reports that she has been smoking Cigarettes.  She has been smoking about 0.50 packs per day. She does not have any smokeless  tobacco history on file. She reports that she drinks alcohol. She reports that she does not use illicit drugs. Allergies:  Allergies  Allergen Reactions  . Penicillins Anaphylaxis    Has patient had a PCN reaction causing immediate rash, facial/tongue/throat swelling, SOB or lightheadedness with hypotension: Yes Has patient had a PCN reaction causing severe rash involving mucus membranes or skin necrosis: No Has patient had a PCN reaction that required hospitalization Yes Has patient had a PCN reaction occurring within the last 10 years: Yes If all of the above answers are "NO", then may proceed with Cephalosporin use.   . Sulfamethoxazole Anaphylaxis   Medications Prior to Admission  Medication Sig Dispense Refill  . Doxepin HCl (SILENOR) 3 MG TABS Take 3 mg by mouth daily.    . DULoxetine (CYMBALTA) 60 MG capsule Take 60 mg by mouth daily.    . fluticasone (FLONASE) 50 MCG/ACT nasal spray Place 1-2 sprays into both nostrils daily.    Marland Kitchen gabapentin (NEURONTIN) 300 MG capsule Take 300 mg by mouth at bedtime.    . hydrochlorothiazide (HYDRODIURIL) 12.5 MG tablet Take 12.5 mg by mouth every morning.    Marland Kitchen ibuprofen (ADVIL,MOTRIN) 200 MG tablet Take 800 mg by mouth every 6 (six) hours as needed for mild pain or moderate pain.    . methocarbamol (ROBAXIN) 500 MG tablet Take 500 mg by mouth every 4 (four) hours as needed for muscle spasms.    . simvastatin (ZOCOR) 80 MG tablet Take 80 mg by mouth at bedtime.      Home: Home Living Family/patient expects to be discharged to:: Private residence Living Arrangements: Other (Comment) ("my man") Available Help at Discharge: Family, Available 24 hours/day Type of Home: House Home Access: Stairs to enter [employer] of Steps: 3;2in the back Entrance Stairs-Rails:  Left Home Layout: One level Bathroom Shower/Tub: Tub/shower unit Home Equipment: Grab bars - tub/shower  Lives With: Significant other   Functional History: Prior  Function Level of Independence: Independent  Functional Status:  Mobility: Bed Mobility Overal bed mobility: Needs Assistance Bed Mobility: Supine to Sit, Sit to Supine Supine to sit: Min assist Sit to supine: Min guard General bed mobility comments: HOB flat, no rail; patient maneuvering linens; extra time and effort; came to left side.  Transfers Overall transfer level: Needs assistance Equipment used: 1 person hand held assist Transfers: Sit to/from Stand Sit to Stand: Min assist General transfer comment: from EOB; Rt knee unstable/weak with assist to prevent hyperextension; x2 Ambulation/Gait Ambulation/Gait assistance: Min assist Ambulation Distance (Feet): 50 Feet Assistive device: 1 person hand held assist (hall rail) Gait Pattern/deviations: Step-through pattern, Decreased stride length, Decreased dorsiflexion - right, Decreased weight shift to right, Decreased weight shift to left, Shuffle, Staggering right (rt knee instability; hyperextension) General Gait Details: Pt with knee hyperextension thrust RLE during stance phase due to right knee instability. Initially pt could not feel this; with facilitation she began to feel and control Rt knee better; Fatigued (had taken a shower and been up in chair all morning). Right lateral trunk lean with decreased arm swing. Gait velocity: decreased Gait velocity interpretation: Below normal speed for age/gender    ADL: ADL Overall ADL's : Needs assistance/impaired Eating/Feeding: Set up, Sitting Eating/Feeding Details (indicate cue type and reason): extra time and effort, dropping items in right hand at times Grooming: Minimal assistance, Sitting Upper Body Bathing: Moderate assistance, Sitting Lower Body Bathing: Moderate assistance, Sit to/from stand Upper Body Dressing : Moderate assistance, Sitting Lower Body Dressing: Moderate assistance, Sit to/from stand Toilet Transfer: Moderate assistance, Ambulation Toileting- Clothing  Manipulation and Hygiene: Moderate assistance, Sit to/from stand Tub/ Shower Transfer: Moderate assistance, Ambulation, 3 in 1 Functional mobility during ADLs: Moderate assistance General ADL Comments: Pt completed in-room functional mobility around bed to sit up in recliner. Decreased safety, decreased insight into deficits, and some impulsivity noted. LOB 3-4x needing mod A to prevent fall. Chair alarm set and educated family on having nursing or therapy staff with pt when walking.   Cognition: Cognition Overall Cognitive Status: Impaired/Different from baseline Orientation Level: Oriented X4 Cognition Arousal/Alertness: Awake/alert (tired, just got back to bed) Behavior During Therapy: Flat affect, Impulsive Overall Cognitive Status: Impaired/Different from baseline Area of Impairment: Attention, Following commands, Safety/judgement, Problem solving, Awareness Current Attention Level: Sustained (easily internally distracted) Memory: Decreased recall of precautions Following Commands: Follows one step commands inconsistently (due to decr attention; distractible) Safety/Judgement: Decreased awareness of safety, Decreased awareness of deficits Awareness: Intellectual Problem Solving: Difficulty sequencing, Slow processing, Requires verbal cues General Comments: Pt with better safety awareness and insight into deficits after gait training, "i am not walking like normal."  Physical Exam: Blood pressure 138/86, pulse 84, temperature 97.9 F (36.6 C), temperature source Oral, resp. rate 22, height 4\' 10"  (1.473 m), weight 75.8 kg (167 lb 1.7 oz), SpO2 100 %. Physical Exam Constitutional: alert sitting in bed. Family present.  HENT: oral mucosa pink and moist Head: Normocephalic.  Eyes: EOM are normal.  Neck: Normal range of motion. Neck supple. No thyromegaly present.  Cardiovascular: Normal rate and regular rhythm.  Respiratory: Effort normal and breath sounds normal. No respiratory  distress.  GI: Soft. Bowel sounds are normal. She exhibits no distension.  Neurological: She is alert and oriented to person, place, and time.  Follows full commands. Fair awareness of  deficits. Normal language. Reasonable memory and insight. Able to carry conversation  Motor strength is 3+ to 4- in the right deltoid, biceps, triceps, grip. 3 to 3+/5 hip flexor, knee extensor, 1-2/5 right ankle dorsiflexor and plantar flexor Left side is 5/5 strength. Sensation intact to light touch in bilateral upper and lower limbs. No evidence of abnormal tone. Sensory exam normal. No evidence of dysmetria Skin: intact Psych: pleasant and cooperative  Results for orders placed or performed during the hospital encounter of 04/23/16 (from the past 48 hour(s))  I-stat troponin, ED (not at [facility], [facility])     Status: None   Collection Time: 04/23/16  4:48 PM  Result Value Ref Range   Troponin i, poc 0.00 0.00 - 0.08 ng/mL   Comment 3            Comment: Due to the release kinetics of cTnI, a negative result within the first hours of the onset of symptoms does not rule out myocardial infarction with certainty. If myocardial infarction is still suspected, repeat the test at appropriate intervals.   I-Stat Chem 8, ED  (not at [facility], [facility])     Status: Abnormal   Collection Time: 04/23/16  4:50 PM  Result Value Ref Range   Sodium 140 135 - 145 mmol/L   Potassium 3.8 3.5 - 5.1 mmol/L   Chloride 105 101 - 111 mmol/L   BUN 10 6 - 20 mg/dL   Creatinine, Ser 1.61 0.44 - 1.00 mg/dL   Glucose, Bld 69 65 - 99 mg/dL   Calcium, Ion 0.96 (L) 1.13 - 1.30 mmol/L   TCO2 23 0 - 100 mmol/L   Hemoglobin 15.3 (H) 12.0 - 15.0 g/dL   HCT 04.5 40.9 - 81.1 %  Lipid panel     Status: Abnormal   Collection Time: 04/24/16  2:28 AM  Result Value Ref Range   Cholesterol 251 (H) 0 - 200 mg/dL   Triglycerides 914 (H) <150 mg/dL   HDL 54 >78 mg/dL   Total CHOL/HDL Ratio 4.6 RATIO   VLDL 50 (H) 0 - 40 mg/dL   LDL Cholesterol 295  (H) 0 - 99 mg/dL    Comment:        Total Cholesterol/HDL:CHD Risk Coronary Heart Disease Risk Table                     Men   Women  1/2 Average Risk   3.4   3.3  Average Risk       5.0   4.4  2 X Average Risk   9.6   7.1  3 X Average Risk  23.4   11.0        Use the calculated Patient Ratio above and the CHD Risk Table to determine the patient's CHD Risk.        ATP III CLASSIFICATION (LDL):  <100     mg/dL   Optimal  621-308  mg/dL   Near or Above                    Optimal  130-159  mg/dL   Borderline  657-846  mg/dL   High  >962     mg/dL   Very High   Urine rapid drug screen (hosp performed)not at [facility]     Status: Abnormal   Collection Time: 04/24/16  4:23 AM  Result Value Ref Range   Opiates NONE DETECTED NONE DETECTED   Cocaine NONE DETECTED NONE DETECTED  Benzodiazepines NONE DETECTED NONE DETECTED   Amphetamines NONE DETECTED NONE DETECTED   Tetrahydrocannabinol POSITIVE (A) NONE DETECTED   Barbiturates NONE DETECTED NONE DETECTED    Comment:        DRUG SCREEN FOR MEDICAL PURPOSES ONLY.  IF CONFIRMATION IS NEEDED FOR ANY PURPOSE, NOTIFY LAB WITHIN 5 DAYS.        LOWEST DETECTABLE LIMITS FOR URINE DRUG SCREEN Drug Class       Cutoff (ng/mL) Amphetamine      1000 Barbiturate      200 Benzodiazepine   200 Tricyclics       300 Opiates          300 Cocaine          300 THC              50   Urinalysis, Routine w reflex microscopic (not at [facility])     Status: Abnormal   Collection Time: 04/24/16  4:23 AM  Result Value Ref Range   Color, Urine YELLOW YELLOW   APPearance CLOUDY (A) CLEAR   Specific Gravity, Urine 1.016 1.005 - 1.030   pH 6.5 5.0 - 8.0   Glucose, UA NEGATIVE NEGATIVE mg/dL   Hgb urine dipstick NEGATIVE NEGATIVE   Bilirubin Urine NEGATIVE NEGATIVE   Ketones, ur NEGATIVE NEGATIVE mg/dL   Protein, ur NEGATIVE NEGATIVE mg/dL   Nitrite NEGATIVE NEGATIVE   Leukocytes, UA TRACE (A) NEGATIVE  Urine microscopic-add on     Status: Abnormal    Collection Time: 04/24/16  4:23 AM  Result Value Ref Range   Squamous Epithelial / LPF 0-5 (A) NONE SEEN   WBC, UA 0-5 0 - 5 WBC/hpf   RBC / HPF NONE SEEN 0 - 5 RBC/hpf   Bacteria, UA RARE (A) NONE SEEN   Urine-Other AMORPHOUS URATES/PHOSPHATES    Ct Head Wo Contrast  04/23/2016  CLINICAL DATA:  Code stroke. EXAM: CT HEAD WITHOUT CONTRAST TECHNIQUE: Contiguous axial images were obtained from the base of the skull through the vertex without intravenous contrast. COMPARISON:  None. FINDINGS: There is no evidence for acute hemorrhage, hydrocephalus, mass lesion, or abnormal extra-axial fluid collection. No definite CT evidence for acute infarction. The visualized paranasal sinuses and mastoid air cells are clear. IMPRESSION: Normal CT evaluation of the brain. Critical Value/emergent results were called by telephone at the time of interpretation on 04/23/2016 at 4:55 pm to April, working with Dr. Amada Jupiter, who verbally acknowledged these results. Electronically Signed   By: Kennith Center M.D.   On: 04/23/2016 16:56   Mr Laqueta Jean ZO Contrast  04/24/2016  CLINICAL DATA:  New onset right-sided weakness. Slurred speech. History of hypertension, hypercholesterolemia, and sarcoidosis. EXAM: MRI HEAD WITHOUT AND WITH CONTRAST MRA HEAD WITHOUT CONTRAST TECHNIQUE: Multiplanar, multiecho pulse sequences of the brain and surrounding structures were obtained without and with intravenous contrast. Angiographic images of the head were obtained using MRA technique without contrast. CONTRAST:  15mL MULTIHANCE GADOBENATE DIMEGLUMINE 529 MG/ML IV SOLN COMPARISON:  Head CT 04/23/2016 FINDINGS: MRI HEAD FINDINGS There is a 9 mm acute infarct in the left cingulate gyrus. There is also a 1.2 cm acute infarct in the left paramedian pons. There is no evidence of intracranial hemorrhage, mass, midline shift, or extra-axial fluid collection. The ventricles and sulci are normal. Patchy T2 hyperintensities are present in the  predominantly deep cerebral white matter bilaterally and are moderately advanced for age. No abnormal brain parenchymal or meningeal enhancement is identified. Orbits are unremarkable. No significant  inflammatory disease is seen in the paranasal sinuses are mastoid air cells. Major intracranial vascular flow voids are preserved. MRA HEAD FINDINGS There is mild motion artifact. The visualized distal vertebral arteries are patent with the left being minimally larger than the right. Left PICA origin is patent. The right PICA origin was not imaged. SCA origins are patent. Basilar artery is widely patent. Posterior communicating arteries are not identified. PCAs are patent without evidence of significant proximal stenosis. The internal carotid arteries are patent from skullbase to carotid termini. Focal areas diminished signal the in the proximal right petrous ICA is favored to be artifactual, with less prominent artifact on the contralateral side at the same level. The ACAs and MCAs are patent without evidence of major branch occlusion or significant proximal stenosis. No intracranial aneurysm is identified. IMPRESSION: 1. 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 2. Moderately advanced cerebral white matter disease for age, nonspecific. Considerations include chronic small vessel ischemia, neurosarcoidosis, other vasculitis, and demyelination. No evidence of dural or leptomeningeal sarcoid involvement. 3. No evidence of major branch occlusion or significant proximal stenosis. Electronically Signed   By: Sebastian Ache M.D.   On: 04/24/2016 09:51   Mr Maxine Glenn Head/brain Wo Cm  04/24/2016  CLINICAL DATA:  New onset right-sided weakness. Slurred speech. History of hypertension, hypercholesterolemia, and sarcoidosis. EXAM: MRI HEAD WITHOUT AND WITH CONTRAST MRA HEAD WITHOUT CONTRAST TECHNIQUE: Multiplanar, multiecho pulse sequences of the brain and surrounding structures were obtained without and with  intravenous contrast. Angiographic images of the head were obtained using MRA technique without contrast. CONTRAST:  15mL MULTIHANCE GADOBENATE DIMEGLUMINE 529 MG/ML IV SOLN COMPARISON:  Head CT 04/23/2016 FINDINGS: MRI HEAD FINDINGS There is a 9 mm acute infarct in the left cingulate gyrus. There is also a 1.2 cm acute infarct in the left paramedian pons. There is no evidence of intracranial hemorrhage, mass, midline shift, or extra-axial fluid collection. The ventricles and sulci are normal. Patchy T2 hyperintensities are present in the predominantly deep cerebral white matter bilaterally and are moderately advanced for age. No abnormal brain parenchymal or meningeal enhancement is identified. Orbits are unremarkable. No significant inflammatory disease is seen in the paranasal sinuses are mastoid air cells. Major intracranial vascular flow voids are preserved. MRA HEAD FINDINGS There is mild motion artifact. The visualized distal vertebral arteries are patent with the left being minimally larger than the right. Left PICA origin is patent. The right PICA origin was not imaged. SCA origins are patent. Basilar artery is widely patent. Posterior communicating arteries are not identified. PCAs are patent without evidence of significant proximal stenosis. The internal carotid arteries are patent from skullbase to carotid termini. Focal areas diminished signal the in the proximal right petrous ICA is favored to be artifactual, with less prominent artifact on the contralateral side at the same level. The ACAs and MCAs are patent without evidence of major branch occlusion or significant proximal stenosis. No intracranial aneurysm is identified. IMPRESSION: 1. 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 2. Moderately advanced cerebral white matter disease for age, nonspecific. Considerations include chronic small vessel ischemia, neurosarcoidosis, other vasculitis, and demyelination. No evidence of dural or  leptomeningeal sarcoid involvement. 3. No evidence of major branch occlusion or significant proximal stenosis. Electronically Signed   By: Sebastian Ache M.D.   On: 04/24/2016 09:51       Medical Problem List and Plan: 1.  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  DVT Prophylaxis/Anticoagulation: SCDs. Monitor for any signs of DVT 3. Pain Management: Neurontin 300 mg daily at bedtime 4. Mood. Sinequan 10 mg daily at bedtime, Cymbalta 60 mg daily. 5. Neuropsych: This patient is capable of making decisions on his own behalf. 6. Skin/Wound Care: Routine skin checks 7. Fluids/Electrolytes/Nutrition: Routine I&O with follow-up chemistries upon admit 8. Hypertension. Hydrochlorothiazide 12.5 mg daily. Monitor increased mobility 9. Hyperlipidemia. Lipitor 10. Tobacco abuse/marijuana. Nicoderm patch. Provide counseling    Post Admission Physician Evaluation: 1. Functional deficits secondary  to left pontine/cingulate cyrus infarct. 2. Patient is admitted to receive collaborative, interdisciplinary care between the physiatrist, rehab nursing staff, and therapy team. 3. Patient's level of medical complexity and substantial therapy needs in context of that medical necessity cannot be provided at a lesser intensity of care such as a SNF. 4. Patient has experienced substantial functional loss from his/her baseline which was documented above under the "Functional History" and "Functional Status" headings.  Judging by the patient's diagnosis, physical exam, and functional history, the patient has potential for functional progress which will result in measurable gains while on inpatient rehab.  These gains will be of substantial and practical use upon discharge  in facilitating mobility and self-care at the household level. 5. Physiatrist will provide 24 hour management of medical needs as well as oversight of the therapy plan/treatment and  provide guidance as appropriate regarding the interaction of the two. 6. 24 hour rehab nursing will assist with bladder management, bowel management, safety, skin/wound care, disease management, medication administration, pain management and patient education  and help integrate therapy concepts, techniques,education, etc. 7. PT will assess and treat for/with: Lower extremity strength, range of motion, stamina, balance, functional mobility, safety, adaptive techniques and equipment, NMR, family education, ego support.   Goals are: mod I. 8. OT will assess and treat for/with: ADL's, functional mobility, safety, upper extremity strength, adaptive techniques and equipment, NMR, community reintegration, ego support, stroke education.   Goals are: mod I. Therapy may proceed with showering this patient. 9. SLP will assess and treat for/with: speech, communication, swallowing.  Goals are: mod I. 10. Case Management and Social Worker will assess and treat for psychological issues and discharge planning. 11. Team conference will be held weekly to assess progress toward goals and to determine barriers to discharge. 12. Patient will receive at least 3 hours of therapy per day at least 5 days per week. 13. ELOS: 7-10 days       14. Prognosis:  excellent     Ranelle Oyster, MD, [facility] Physical Medicine & Rehabilitation 04/25/2016   04/25/2016

## 2016-04-25 NOTE — Progress Notes (Signed)
Report given to nurse on 4100. Pt transferred and discharge instructions reviewed.

## 2016-04-25 NOTE — Progress Notes (Signed)
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 right handed female with history of hypertension, hyperlipidemia, tobacco abuse. Per chart review patient lives with significant other versus friend. One level home. Independent prior to admission. Presented 04/23/2016 with slurred speech and right-sided weakness. Cranial CT scan negative. 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 No evidence of major occlusion or significant proximal stenosis per MRA. Patient did not receive TPA. Echocardiogram with ejection fraction of 65% grade 1 diastolic dysfunction. Carotid Dopplers with no ICA stenosis. Cardiology services consulted placed on aspirin for CVA prophylaxis. Tolerating a regular diet.  Patient states that she was independent prior to admission and was doing housework as well. Did not utilize assisted device  Review of Systems  Constitutional: Negative for fever and chills.  HENT: Negative for hearing loss.   Occasional headache  Eyes: Negative for blurred vision and double vision.  Respiratory: Positive for cough. Negative for shortness of breath.  Cardiovascular: Negative for chest pain, palpitations and leg swelling.  Gastrointestinal: Positive for constipation. Negative for nausea and vomiting.  Genitourinary: Negative for dysuria and hematuria.  Musculoskeletal: Positive for myalgias.  Skin: Negative for rash.  Neurological: Positive for weakness. Negative for seizures.  All other systems reviewed and are negative.  Past Medical History  Diagnosis Date  .  Hypertension   . Hypercholesteremia   . Sarcoidosis (HCC)   . Pneumothorax    History reviewed. No pertinent past surgical history. No family history on file. Social History:  reports that she has been smoking Cigarettes. She has been smoking about 0.50 packs per day. She does not have any smokeless tobacco history on file. She reports that she drinks alcohol. She reports that she does not use illicit drugs. Allergies:  Allergies  Allergen Reactions  . Penicillins Anaphylaxis    Has patient had a PCN reaction causing immediate rash, facial/tongue/throat swelling, SOB or lightheadedness with hypotension: Yes Has patient had a PCN reaction causing severe rash involving mucus membranes or skin necrosis: No Has patient had a PCN reaction that required hospitalization Yes Has patient had a PCN reaction occurring within the last 10 years: Yes If all of the above answers are "NO", then may proceed with Cephalosporin use.   . Sulfamethoxazole Anaphylaxis   Medications Prior to Admission  Medication Sig Dispense Refill  . Doxepin HCl (SILENOR) 3 MG TABS Take 3 mg by mouth daily.    . DULoxetine (CYMBALTA) 60 MG capsule Take 60 mg by mouth daily.    . fluticasone (FLONASE) 50 MCG/ACT nasal spray Place 1-2 sprays into both nostrils daily.    Marland Kitchen. gabapentin (NEURONTIN) 300 MG capsule Take 300 mg by mouth at bedtime.    . hydrochlorothiazide (HYDRODIURIL) 12.5 MG tablet Take 12.5 mg by mouth every morning.    Marland Kitchen. ibuprofen (ADVIL,MOTRIN) 200 MG tablet Take 800 mg by mouth every 6 (six) hours as needed for mild pain or moderate pain.    . methocarbamol (ROBAXIN) 500 MG tablet Take 500 mg by mouth every 4 (four) hours as needed for muscle spasms.    . simvastatin (ZOCOR)  80 MG tablet Take 80 mg by mouth at bedtime.      Home: Home Living Family/patient expects to be discharged to:: Private residence Living Arrangements: Other  (Comment) ("my man") Available Help at Discharge: Family, Available 24 hours/day Type of Home: House Home Access: Stairs to enter [employer] of Steps: 3;2in the back Entrance Stairs-Rails: Left Home Layout: One level Bathroom Shower/Tub: Tub/shower unit Home Equipment: Grab bars - tub/shower  Functional History: Prior Function Level of Independence: Independent Functional Status:  Mobility: Bed Mobility Overal bed mobility: Needs Assistance Bed Mobility: Supine to Sit Supine to sit: Min guard, HOB elevated General bed mobility comments: Up in chair upon PT arrival.  Transfers Overall transfer level: Needs assistance Equipment used: 1 person hand held assist Transfers: Sit to/from Stand Sit to Stand: Min assist General transfer comment: Min A to boost from chair x2. Impulsive. Attempting to stand without assist.  Ambulation/Gait Ambulation/Gait assistance: Min assist Ambulation Distance (Feet): 100 Feet (x2 bouts) Assistive device: (rail in hallway) Gait Pattern/deviations: Step-to pattern, Decreased stance time - right, Decreased weight shift to right, Narrow base of support General Gait Details: Pt with knee hyperextension thrust RLE during stance phase due to right knee instability. 1 seated rest break. Fatigues quickly. Right lateral trunk lean with decreased arm swing. Gait velocity: decreased Gait velocity interpretation: Below normal speed for age/gender    ADL: ADL Overall ADL's : Needs assistance/impaired Eating/Feeding: Set up, Sitting Eating/Feeding Details (indicate cue type and reason): extra time and effort, dropping items in right hand at times Grooming: Minimal assistance, Sitting Upper Body Bathing: Moderate assistance, Sitting Lower Body Bathing: Moderate assistance, Sit to/from stand Upper Body Dressing : Moderate assistance, Sitting Lower Body Dressing: Moderate assistance, Sit to/from stand Toilet Transfer: Moderate assistance,  Ambulation Toileting- Clothing Manipulation and Hygiene: Moderate assistance, Sit to/from stand Tub/ Shower Transfer: Moderate assistance, Ambulation, 3 in 1 Functional mobility during ADLs: Moderate assistance General ADL Comments: Pt completed in-room functional mobility around bed to sit up in recliner. Decreased safety, decreased insight into deficits, and some impulsivity noted. LOB 3-4x needing mod A to prevent fall. Chair alarm set and educated family on having nursing or therapy staff with pt when walking.   Cognition: Cognition Overall Cognitive Status: Impaired/Different from baseline Orientation Level: Oriented X4 Cognition Arousal/Alertness: Awake/alert Behavior During Therapy: Flat affect Overall Cognitive Status: Impaired/Different from baseline Area of Impairment: Attention, Following commands, Safety/judgement, Problem solving Current Attention Level: Sustained Memory: Decreased recall of precautions Following Commands: Follows one step commands inconsistently Safety/Judgement: Decreased awareness of safety, Decreased awareness of deficits Problem Solving: Difficulty sequencing, Slow processing, Requires verbal cues General Comments: Pt with better safety awareness and insight into deficits after gait training, "i am not walking like normal."  Blood pressure 118/65, pulse 74, temperature 98.8 F (37.1 C), temperature source Oral, resp. rate 20, height 4\' 10"  (1.473 m), weight 75.8 kg (167 lb 1.7 oz), SpO2 100 %. Physical Exam  Vitals reviewed. Constitutional: She is oriented to person, place, and time.  HENT:  Head: Normocephalic.  Eyes: EOM are normal.  Neck: Normal range of motion. Neck supple. No thyromegaly present.  Cardiovascular: Normal rate and regular rhythm.  Respiratory: Effort normal and breath sounds normal. No respiratory distress.  GI: Soft. Bowel sounds are normal. She exhibits no distension.  Neurological: She is alert and oriented to person,  place, and time.  Follows full commands. Fair awareness of deficits  Skin: Skin is warm and dry.  Motor strength is 3+ in the  right deltoid, biceps, triceps, grip, hip flexor, knee extensor, trace right ankle dorsiflexor and plantar flexor Left side is 5/5 strength. Sensation intact to light touch in bilateral upper and lower limbs. No evidence of abnormal tone. No evidence of dysmetria   Lab Results Last 24 Hours    Results for orders placed or performed during the hospital encounter of 04/23/16 (from the past 24 hour(s))  Ethanol Status: None   Collection Time: 04/23/16 4:38 PM  Result Value Ref Range   Alcohol, Ethyl (B) <5 <5 mg/dL  Protime-INR Status: None   Collection Time: 04/23/16 4:38 PM  Result Value Ref Range   Prothrombin Time 14.3 11.6 - 15.2 seconds   INR 1.09 0.00 - 1.49  APTT Status: None   Collection Time: 04/23/16 4:38 PM  Result Value Ref Range   aPTT 27 24 - 37 seconds  CBC Status: None   Collection Time: 04/23/16 4:38 PM  Result Value Ref Range   WBC 7.0 4.0 - 10.5 K/uL   RBC 4.66 3.87 - 5.11 MIL/uL   Hemoglobin 14.6 12.0 - 15.0 g/dL   HCT 16.1 09.6 - 04.5 %   MCV 91.8 78.0 - 100.0 fL   MCH 31.3 26.0 - 34.0 pg   MCHC 34.1 30.0 - 36.0 g/dL   RDW 40.9 81.1 - 91.4 %   Platelets 207 150 - 400 K/uL  Differential Status: None   Collection Time: 04/23/16 4:38 PM  Result Value Ref Range   Neutrophils Relative % 53 %   Neutro Abs 3.7 1.7 - 7.7 K/uL   Lymphocytes Relative 35 %   Lymphs Abs 2.5 0.7 - 4.0 K/uL   Monocytes Relative 10 %   Monocytes Absolute 0.7 0.1 - 1.0 K/uL   Eosinophils Relative 2 %   Eosinophils Absolute 0.1 0.0 - 0.7 K/uL   Basophils Relative 0 %   Basophils Absolute 0.0 0.0 - 0.1 K/uL  Comprehensive metabolic panel Status: None   Collection Time: 04/23/16 4:38 PM  Result Value  Ref Range   Sodium 139 135 - 145 mmol/L   Potassium 3.8 3.5 - 5.1 mmol/L   Chloride 105 101 - 111 mmol/L   CO2 26 22 - 32 mmol/L   Glucose, Bld 73 65 - 99 mg/dL   BUN 10 6 - 20 mg/dL   Creatinine, Ser 7.82 0.44 - 1.00 mg/dL   Calcium 9.8 8.9 - 95.6 mg/dL   Total Protein 8.0 6.5 - 8.1 g/dL   Albumin 4.1 3.5 - 5.0 g/dL   AST 24 15 - 41 U/L   ALT 18 14 - 54 U/L   Alkaline Phosphatase 83 38 - 126 U/L   Total Bilirubin 0.6 0.3 - 1.2 mg/dL   GFR calc non Af Amer >60 >60 mL/min   GFR calc Af Amer >60 >60 mL/min   Anion gap 8 5 - 15  I-stat troponin, ED (not at [facility], [facility]) Status: None   Collection Time: 04/23/16 4:48 PM  Result Value Ref Range   Troponin i, poc 0.00 0.00 - 0.08 ng/mL   Comment 3     I-Stat Chem 8, ED (not at [facility], [facility]) Status: Abnormal   Collection Time: 04/23/16 4:50 PM  Result Value Ref Range   Sodium 140 135 - 145 mmol/L   Potassium 3.8 3.5 - 5.1 mmol/L   Chloride 105 101 - 111 mmol/L   BUN 10 6 - 20 mg/dL   Creatinine, Ser 2.13 0.44 - 1.00 mg/dL   Glucose, Bld 69 65 -  99 mg/dL   Calcium, Ion 1.61 (L) 1.13 - 1.30 mmol/L   TCO2 23 0 - 100 mmol/L   Hemoglobin 15.3 (H) 12.0 - 15.0 g/dL   HCT 09.6 04.5 - 40.9 %  Lipid panel Status: Abnormal   Collection Time: 04/24/16 2:28 AM  Result Value Ref Range   Cholesterol 251 (H) 0 - 200 mg/dL   Triglycerides 811 (H) <150 mg/dL   HDL 54 >91 mg/dL   Total CHOL/HDL Ratio 4.6 RATIO   VLDL 50 (H) 0 - 40 mg/dL   LDL Cholesterol 478 (H) 0 - 99 mg/dL  Urine rapid drug screen (hosp performed)not at [facility] Status: Abnormal   Collection Time: 04/24/16 4:23 AM  Result Value Ref Range   Opiates NONE DETECTED NONE DETECTED   Cocaine NONE DETECTED NONE DETECTED   Benzodiazepines NONE DETECTED NONE DETECTED   Amphetamines NONE  DETECTED NONE DETECTED   Tetrahydrocannabinol POSITIVE (A) NONE DETECTED   Barbiturates NONE DETECTED NONE DETECTED  Urinalysis, Routine w reflex microscopic (not at [facility]) Status: Abnormal   Collection Time: 04/24/16 4:23 AM  Result Value Ref Range   Color, Urine YELLOW YELLOW   APPearance CLOUDY (A) CLEAR   Specific Gravity, Urine 1.016 1.005 - 1.030   pH 6.5 5.0 - 8.0   Glucose, UA NEGATIVE NEGATIVE mg/dL   Hgb urine dipstick NEGATIVE NEGATIVE   Bilirubin Urine NEGATIVE NEGATIVE   Ketones, ur NEGATIVE NEGATIVE mg/dL   Protein, ur NEGATIVE NEGATIVE mg/dL   Nitrite NEGATIVE NEGATIVE   Leukocytes, UA TRACE (A) NEGATIVE  Urine microscopic-add on Status: Abnormal   Collection Time: 04/24/16 4:23 AM  Result Value Ref Range   Squamous Epithelial / LPF 0-5 (A) NONE SEEN   WBC, UA 0-5 0 - 5 WBC/hpf   RBC / HPF NONE SEEN 0 - 5 RBC/hpf   Bacteria, UA RARE (A) NONE SEEN   Urine-Other AMORPHOUS URATES/PHOSPHATES       Imaging Results (Last 48 hours)    Ct Head Wo Contrast  04/23/2016 CLINICAL DATA: Code stroke. EXAM: CT HEAD WITHOUT CONTRAST TECHNIQUE: Contiguous axial images were obtained from the base of the skull through the vertex without intravenous contrast. COMPARISON: None. FINDINGS: There is no evidence for acute hemorrhage, hydrocephalus, mass lesion, or abnormal extra-axial fluid collection. No definite CT evidence for acute infarction. The visualized paranasal sinuses and mastoid air cells are clear. IMPRESSION: Normal CT evaluation of the brain. Critical Value/emergent results were called by telephone at the time of interpretation on 04/23/2016 at 4:55 pm to April, working with Dr. Amada Jupiter, who verbally acknowledged these results. Electronically Signed By: Kennith Center M.D. On: 04/23/2016 16:56   Mr Laqueta Jean GN Contrast  04/24/2016 CLINICAL DATA: New onset right-sided  weakness. Slurred speech. History of hypertension, hypercholesterolemia, and sarcoidosis. EXAM: MRI HEAD WITHOUT AND WITH CONTRAST MRA HEAD WITHOUT CONTRAST TECHNIQUE: Multiplanar, multiecho pulse sequences of the brain and surrounding structures were obtained without and with intravenous contrast. Angiographic images of the head were obtained using MRA technique without contrast. CONTRAST: 15mL MULTIHANCE GADOBENATE DIMEGLUMINE 529 MG/ML IV SOLN COMPARISON: Head CT 04/23/2016 FINDINGS: MRI HEAD FINDINGS There is a 9 mm acute infarct in the left cingulate gyrus. There is also a 1.2 cm acute infarct in the left paramedian pons. There is no evidence of intracranial hemorrhage, mass, midline shift, or extra-axial fluid collection. The ventricles and sulci are normal. Patchy T2 hyperintensities are present in the predominantly deep cerebral white matter bilaterally and are moderately advanced for age. No abnormal  brain parenchymal or meningeal enhancement is identified. Orbits are unremarkable. No significant inflammatory disease is seen in the paranasal sinuses are mastoid air cells. Major intracranial vascular flow voids are preserved. MRA HEAD FINDINGS There is mild motion artifact. The visualized distal vertebral arteries are patent with the left being minimally larger than the right. Left PICA origin is patent. The right PICA origin was not imaged. SCA origins are patent. Basilar artery is widely patent. Posterior communicating arteries are not identified. PCAs are patent without evidence of significant proximal stenosis. The internal carotid arteries are patent from skullbase to carotid termini. Focal areas diminished signal the in the proximal right petrous ICA is favored to be artifactual, with less prominent artifact on the contralateral side at the same level. The ACAs and MCAs are patent without evidence of major branch occlusion or significant proximal stenosis. No intracranial aneurysm is identified.  IMPRESSION: 1. 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 2. Moderately advanced cerebral white matter disease for age, nonspecific. Considerations include chronic small vessel ischemia, neurosarcoidosis, other vasculitis, and demyelination. No evidence of dural or leptomeningeal sarcoid involvement. 3. No evidence of major branch occlusion or significant proximal stenosis. Electronically Signed By: Sebastian Ache M.D. On: 04/24/2016 09:51   Mr Maxine Glenn Head/brain Wo Cm  04/24/2016 CLINICAL DATA: New onset right-sided weakness. Slurred speech. History of hypertension, hypercholesterolemia, and sarcoidosis. EXAM: MRI HEAD WITHOUT AND WITH CONTRAST MRA HEAD WITHOUT CONTRAST TECHNIQUE: Multiplanar, multiecho pulse sequences of the brain and surrounding structures were obtained without and with intravenous contrast. Angiographic images of the head were obtained using MRA technique without contrast. CONTRAST: 15mL MULTIHANCE GADOBENATE DIMEGLUMINE 529 MG/ML IV SOLN COMPARISON: Head CT 04/23/2016 FINDINGS: MRI HEAD FINDINGS There is a 9 mm acute infarct in the left cingulate gyrus. There is also a 1.2 cm acute infarct in the left paramedian pons. There is no evidence of intracranial hemorrhage, mass, midline shift, or extra-axial fluid collection. The ventricles and sulci are normal. Patchy T2 hyperintensities are present in the predominantly deep cerebral white matter bilaterally and are moderately advanced for age. No abnormal brain parenchymal or meningeal enhancement is identified. Orbits are unremarkable. No significant inflammatory disease is seen in the paranasal sinuses are mastoid air cells. Major intracranial vascular flow voids are preserved. MRA HEAD FINDINGS There is mild motion artifact. The visualized distal vertebral arteries are patent with the left being minimally larger than the right. Left PICA origin is patent. The right PICA origin was not imaged. SCA origins are patent. Basilar  artery is widely patent. Posterior communicating arteries are not identified. PCAs are patent without evidence of significant proximal stenosis. The internal carotid arteries are patent from skullbase to carotid termini. Focal areas diminished signal the in the proximal right petrous ICA is favored to be artifactual, with less prominent artifact on the contralateral side at the same level. The ACAs and MCAs are patent without evidence of major branch occlusion or significant proximal stenosis. No intracranial aneurysm is identified. IMPRESSION: 1. 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 2. Moderately advanced cerebral white matter disease for age, nonspecific. Considerations include chronic small vessel ischemia, neurosarcoidosis, other vasculitis, and demyelination. No evidence of dural or leptomeningeal sarcoid involvement. 3. No evidence of major branch occlusion or significant proximal stenosis. Electronically Signed By: Sebastian Ache M.D. 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 1. Does the need for close, 24 hr/day medical supervision  in concert with the patient's rehab needs make it unreasonable for this patient to be served in a less intensive setting? Yes 2. Co-Morbidities requiring supervision/potential complications: Hypertension, hyperlipidemia, dysarthria 3. Due to bladder management, bowel management, safety, skin/wound care, disease management, medication administration, pain management and patient education, does the patient require 24 hr/day rehab nursing? Yes 4. Does the patient require coordinated care of a physician, rehab nurse, PT (1-2 hrs/day, 5 days/week), OT (1-2 hrs/day, 5 days/week) and SLP (0.5-1 hrs/day, 5 days/week) to address physical and functional deficits in the context of the above medical diagnosis(es)? Yes Addressing deficits in the following areas: balance,  endurance, locomotion, strength, transferring, bowel/bladder control, bathing, dressing, feeding, grooming, toileting, cognition, speech and psychosocial support 5. Can the patient actively participate in an intensive therapy program of at least 3 hrs of therapy per day at least 5 days per week? Yes 6. The potential for patient to make measurable gains while on inpatient rehab is excellent 7. Anticipated functional outcomes upon discharge from inpatient rehab are modified independent and supervision with PT, modified independent and supervision with OT, modified independent and supervision with SLP. 8. Estimated rehab length of stay to reach the above functional goals is: 10-14 days 9. Does the patient have adequate social supports and living environment to accommodate these discharge functional goals? Yes 10. Anticipated D/C setting: Home 11. Anticipated post D/C treatments: HH therapy 12. Overall Rehab/Functional Prognosis: excellent  RECOMMENDATIONS: This patient's condition is appropriate for continued rehabilitative care in the following setting: CIR Patient has agreed to participate in recommended program. Yes Note that insurance prior authorization may be required for reimbursement for recommended care.  Comment: Needs to complete stroke workup    04/24/2016       Revision History     Date/Time User Provider Type Action   04/24/2016 4:47 PM Sharon Colace, MD Physician Sign   04/24/2016 3:32 PM Charlton Amor, PA-C Physician Assistant Pend   View Details Report

## 2016-04-25 NOTE — Progress Notes (Signed)
Inpatient Rehabilitation  Met with patient to discuss team's recommendation for IP Rehab.  Shared booklets and answered questions.  Patient agreeable with plan.  I have initiated insurance authorization and am hopeful for a decision today.  Will keep team updated as I know.  Please call with questions.    Carmelia Roller., CCC/SLP Admission Coordinator  [place]  Cell [phone]

## 2016-04-25 NOTE — Progress Notes (Addendum)
Inpatient Rehabilitation  Received insurance authorization to admit patient to IP Rehab today.  I await medical clearance from Dr. Elisabeth Pigeonevine prior to admitting today.  Please call with questions.     Charlane FerrettiMelissa Jahrell Hamor, M.A., CCC/SLP Admission Coordinator  [facility]  Cell [phone]

## 2016-04-25 NOTE — Care Management Important Message (Signed)
Important Message  Patient Details  Name: Sharon Roberson MRN: [id number] Date of Birth: 12/30/1965   Medicare Important Message Given:  Yes    Bernadette HoitShoffner, Riggin Cuttino Coleman 04/25/2016, 8:24 AM

## 2016-04-25 NOTE — Discharge Instructions (Signed)
Rehabilitation After a Stroke, Adult A stroke can cause many types of problems. The treatment of stroke involves three stages:  Prevention.  Treatment immediately following a stroke.  Rehabilitation after a stroke. HOW IS MY REHABILITATION PLAN DEVELOPED? A detailed exam by your health care provider helps outline what problems were caused by the stroke. Your health care provider may consult specialists. The specialists may include doctors, occupational and physical therapists, and speech therapists. It is then possible to make a plan that best fits your needs.  Your evaluation might include the following:  Evaluation of your ability to do daily activities that require using muscles, coordination, vision, reasoning, memory, and problem solving. Interviews with you and your health care provider will help determine what you could do and could not do before the stroke.  Evaluation of your ability to do personal self-care tasks, such as dressing, grooming, and eating.  Tests to see if there are sensory and motor changes due to the stroke, especially in the hands and legs. WHAT ARE THE TYPES OF REHABILITATION? Your health care provider may have you start rehabilitation right away depending on the type and severity of your stroke. Rehabilitation after a stroke is focused on getting function back and preventing another stroke. Rehabilitation might include:   Physical therapy. This can include help with walking, sitting, lying down, and balance. It may also be designed to help prevent shortening of the muscles (contractures) and swelling (edema).  Occupational therapy. This therapy helps you to relearn skills needed for leading a normal life. These could include eating, using the restroom, dressing, and taking care of yourself. It helps to make you more independent.  Vision therapy. This can help you to retrain, strengthen, and improve your vision after a stroke.  Speech therapy. This can help to  improve your speech and communication skills. It also teaches you and your family members to cope with problems of being unable to communicate.  Cognitive therapy. This therapy can help with problems caused by lack of memory, attention, or concentration.  Psychological or psychiatric therapy. This can help you cope with problems of frustration and emotional problems that may develop after a stroke.    This information is not intended to replace advice given to you by your health care provider. Make sure you discuss any questions you have with your health care provider.   Document Released: 10/12/2007 Document Revised: 02/06/2015 Document Reviewed: 02/24/2013 Elsevier Interactive Patient Education 2016 Elsevier Inc.  

## 2016-04-25 NOTE — PMR Pre-admission (Signed)
PMR Admission Coordinator Pre-Admission Assessment  Patient: Sharon Roberson is an 50 y.o., female MRN: [id number] DOB: 18-Jan-1966 Height: 4\' 10"  (147.3 cm) Weight: 75.8 kg (167 lb 1.7 oz)              Insurance Information HMO: X    PPO:      PCP:      IPA:      80/20:      OTHER:  PRIMARY: BCBS Medicare       Policy#: [id number]      Subscriber: Self CM Name:                                    Phone#:                  Fax#:  Pre-Cert#:                                                                    Employer:  Benefits:  Phone #: [phone]     Name: [place]arm[place]  Eff. Date: 11/06/10     Deduct: $0      Out of Pocket Max: $6,700      Life Max: none CIR: $265 days 1-6; $0 days 7-190      SNF: $0 days 1-20; $164.50 days 21-100 Outpatient: PT/OT/SLP     Co-Pay: $40 Home Health: PT/OT/SLP      Co-Pay: $0 DME: 80%     Co-Pay: 20% Providers: in network   SECONDARY: Medicaid [place] Access      Policy#: [id number]      Subscriber: Self CM Name:       Phone#:      Fax#:  Pre-Cert#: Eligible as of 04/25/16; Code: MADQN      Employer:  Benefits:  Phone #: [phone]     Name:  Eff. 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  History of Present Illness: Sharon Roberson is a 50 y.o. right handed female with history of hypertension, hyperlipidemia, tobacco abuse. Per chart review patient lives with significant other versus friend. One level home. Independent prior to  admission. Presented 04/23/2016 with slurred speech and right-sided weakness. Urine drug screen positive marijuana. Cranial CT scan negative. 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 No evidence of major occlusion or significant proximal stenosis per MRA. Patient did not receive TPA. Echocardiogram with ejection fraction of 65% grade  1 diastolic dysfunction. Carotid Dopplers with no ICA stenosis. Cardiology services consulted placed on aspirin for CVA prophylaxis. Tolerating a regular diet. Physical therapy evaluation completed. Patient was admitted for comprehensive rehabilitation program.  NIH Total: 1    Past Medical History  Past Medical History  Diagnosis Date  . Hypertension   . Hypercholesteremia   . Sarcoidosis (HCC)   . Pneumothorax     Family History  family history is not on file.  Prior Rehab/Hospitalizations:  Has the patient had major surgery during 100 days prior to admission? No  Current Medications   Current facility-administered medications:  .  acetaminophen (TYLENOL) tablet 650 mg, 650 mg, Oral, Q6H PRN, Alison Murray, MD, 650 mg at 04/24/16 1842 .  aspirin suppository 300 mg, 300 mg, Rectal, Daily **OR** aspirin tablet 325 mg, 325 mg, Oral, Daily, Alison Murray, MD, 325 mg at 04/25/16 0913 .  atorvastatin (LIPITOR) tablet 40 mg, 40 mg, Oral, q1800, Alison Murray, MD, 40 mg at 04/24/16 1718 .  doxepin (SINEQUAN) 10 MG/ML solution 3 mg, 3 mg, Oral, QHS, Alison Murray, MD, 3 mg at 04/24/16 2125 .  DULoxetine (CYMBALTA) DR capsule 60 mg, 60 mg, Oral, Daily, Alison Murray, MD, 60 mg at 04/25/16 0914 .  famotidine (PEPCID) tablet 40 mg, 40 mg, Oral, Daily, Gerome Apley [place], PA-C, 40 mg at 04/25/16 0913 .  gabapentin (NEURONTIN) capsule 300 mg, 300 mg, Oral, QHS, Alison Murray, MD, 300 mg at 04/24/16 2125 .  hydrochlorothiazide (HYDRODIURIL) tablet 12.5 mg, 12.5 mg, Oral, q morning - 10a, Alison Murray, MD, 12.5 mg at 04/25/16 0915 .  nicotine  (NICODERM CQ - dosed in mg/24 hours) patch 14 mg, 14 mg, Transdermal, Daily, Alison Murray, MD, 14 mg at 04/25/16 0916 .  senna-docusate (Senokot-S) tablet 1 tablet, 1 tablet, Oral, QHS PRN, Alison Murray, MD  Patients Current Diet: Diet Heart Room service appropriate?: Yes; Fluid consistency:: Thin  Precautions / Restrictions Precautions Precautions: Fall Restrictions Weight Bearing Restrictions: No   Has the patient had 2 or more falls or a fall with injury in the past year?No  Prior Activity Level Community (5-7x/wk): Prior to admission patient did not work or drive; however, she was active around the house and out in the community daily.    Home Assistive Devices / Equipment Home Assistive Devices/Equipment: None Home Equipment: Grab bars - tub/shower  Prior Device Use: Indicate devices/aids used by the patient prior to current illness, exacerbation or injury? None of the above  Prior Functional Level Prior Function Level of Independence: Independent  Self Care: Did the patient need help bathing, dressing, using the toilet or eating?  Independent  Indoor Mobility: Did the patient need assistance with walking from room to room (with or without device)? Independent  Stairs: Did the patient need assistance with internal or external stairs (with or without device)? Independent  Functional Cognition: Did the patient need help planning regular tasks such as shopping or remembering to take medications? Independent  Current Functional Level Cognition  Overall Cognitive Status: Impaired/Different from baseline Current Attention Level: Sustained (easily internally distracted) Orientation Level: Oriented X4 Following Commands: Follows one step commands inconsistently (due to decr attention; distractible) Safety/Judgement: Decreased awareness of safety, Decreased awareness of deficits General Comments: Pt with better safety awareness and insight into deficits after gait training, "i  am not walking like normal."    Extremity Assessment (includes Sensation/Coordination)  Upper Extremity Assessment: Defer to OT evaluation RUE Deficits /  Details: grossly 3/5, reports sensation intact, decreased proprioception; observed to fed self with RUE with increased time and effort. dropping items in right hand at times. RUE Sensation: decreased proprioception RUE Coordination: decreased fine motor, decreased gross motor  Lower Extremity Assessment: RLE deficits/detail RLE Deficits / Details: Grossly ~2+/5 quadriceps, hip flexion, knee flexion. RLE Sensation:  (Reports WFL; proprioception WFL.) RLE Coordination: decreased gross motor, decreased fine motor    ADLs  Overall ADL's : Needs assistance/impaired Eating/Feeding: Set up, Sitting Eating/Feeding Details (indicate cue type and reason): extra time and effort, dropping items in right hand at times Grooming: Minimal assistance, Sitting Upper Body Bathing: Moderate assistance, Sitting Lower Body Bathing: Moderate assistance, Sit to/from stand Upper Body Dressing : Moderate assistance, Sitting Lower Body Dressing: Moderate assistance, Sit to/from stand Toilet Transfer: Moderate assistance, Ambulation Toileting- Clothing Manipulation and Hygiene: Moderate assistance, Sit to/from stand Tub/ Shower Transfer: Moderate assistance, Ambulation, 3 in 1 Functional mobility during ADLs: Moderate assistance General ADL Comments: Pt completed in-room functional mobility around bed to sit up in recliner. Decreased safety, decreased insight into deficits, and some impulsivity noted. LOB 3-4x needing mod A to prevent fall. Chair alarm set and educated family on having nursing or therapy staff with pt when walking.     Mobility  Overal bed mobility: Needs Assistance Bed Mobility: Supine to Sit, Sit to Supine Supine to sit: Min assist Sit to supine: Min guard General bed mobility comments: HOB flat, no rail; patient maneuvering linens; extra  time and effort; came to left side.     Transfers  Overall transfer level: Needs assistance Equipment used: 1 person hand held assist Transfers: Sit to/from Stand Sit to Stand: Min assist General transfer comment: from EOB; Rt knee unstable/weak with assist to prevent hyperextension; x2    Ambulation / Gait / Stairs / Wheelchair Mobility  Ambulation/Gait Ambulation/Gait assistance: ArchitectMin assist Ambulation Distance (Feet): 50 Feet Assistive device: 1 person hand held assist (hall rail) Gait Pattern/deviations: Step-through pattern, Decreased stride length, Decreased dorsiflexion - right, Decreased weight shift to right, Decreased weight shift to left, Shuffle, Staggering right (rt knee instability; hyperextension) General Gait Details: Pt with knee hyperextension thrust RLE during stance phase due to right knee instability. Initially pt could not feel this; with facilitation she began to feel and control Rt knee better; Fatigued (had taken a shower and been up in chair all morning). Right lateral trunk lean with decreased arm swing. Gait velocity: decreased Gait velocity interpretation: Below normal speed for age/gender    Posture / Balance Dynamic Sitting Balance Sitting balance - Comments: Sat EOB and donned socks. Right trunk weakness noted when donning left sock with pt leaning onto right side out of midline. Balance Overall balance assessment: Needs assistance Sitting-balance support: Feet supported, No upper extremity supported Sitting balance-Leahy Scale: Fair Sitting balance - Comments: Sat EOB and donned socks. Right trunk weakness noted when donning left sock with pt leaning onto right side out of midline. Standing balance support: No upper extremity supported, During functional activity Standing balance-Leahy Scale: Poor Standing balance comment: min assist with imbalance    Special needs/care consideration BiPAP/CPAP: No CPM: No Continuous Drip IV: No Dialysis: No          Life Vest: No Oxygen: No Special Bed: No  Trach Size: No Wound Vac (area): No       Skin: WDL  Bowel mgmt: PTA on 04/23/16 Bladder mgmt: continent  Diabetic mgmt: N/A     Previous Home Environment Living Arrangements: Other (Comment) ("my man")  Lives With: Significant other Available Help at Discharge: Family, Available 24 hours/day Type of Home: House Home Layout: One level Home Access: Stairs to enter Entrance Stairs-Rails: Left Entrance Stairs-Number of Steps: 3;2in the back Bathroom Shower/Tub: Tub/shower unit Home Care Services: No  Discharge Living Setting Plans for Discharge Living Setting: Patient's home Type of Home at Discharge: House Discharge Home Layout: One level Discharge Home Access: Stairs to enter Entrance Stairs-Rails: Left Entrance Stairs-Number of Steps: 2 Discharge Bathroom Shower/Tub: Tub/shower unit, Door Discharge Bathroom Toilet: Standard Discharge Bathroom Accessibility: Yes How Accessible: Accessible via walker Does the patient have any problems obtaining your medications?: No  Social/Family/Support Systems Patient Roles: Partner, Parent Contact Information: Daughter: Emi [place] [phone] Anticipated Caregiver: Boyfriend: Katina Dung [phone] Ability/Limitations of Caregiver: None Caregiver Availability: 24/7 Discharge Plan Discussed with Primary Caregiver: Yes Is Caregiver In Agreement with Plan?: Yes Does Caregiver/Family have Issues with Lodging/Transportation while Pt is in Rehab?: No  Goals/Additional Needs Patient/Family Goal for Rehab: PT/OT/SLP Supervision-Mod I  Expected length of stay: 10-14 days  Cultural Considerations: None Dietary Needs: None Equipment Needs: TBD Special Service Needs: None Additional Information: None Pt/Family Agrees to Admission and willing to participate: Yes Program Orientation Provided & Reviewed with Pt/Caregiver Including Roles  &  Responsibilities: Yes Additional Information Needs: None Information Needs to be Provided By: N/A  Decrease burden of Care through IP rehab admission: No  Possible need for SNF placement upon discharge: No  Patient Condition: This patient's condition remains as documented in the consult dated 04/24/16, in which the Rehabilitation Physician determined and documented that the patient's condition is appropriate for intensive rehabilitative care in an inpatient rehabilitation facility. Will admit to inpatient rehab today.  Preadmission Screen Completed By:  Fae Pippin, 04/25/2016 2:10 PM ______________________________________________________________________   Discussed status with Dr. Riley Kill on 04/25/16 at 1500 and received telephone approval for admission today.  Admission Coordinator:  Fae Pippin, time 1500/Date 04/25/16

## 2016-04-25 NOTE — Progress Notes (Signed)
Patient ID: Sharon Roberson, female   DOB: 06-26-1966, 50 y.o.   MRN: [id number]  PROGRESS NOTE    Sharon Roberson  YQM:[id number] DOB: [birth year] DOA: 04/23/2016  PCP: No primary care provider on file.   Brief Narrative:  50 y.o. female with medical history significant for hypertension, neuropathy, dyslipidemia who presented to [facility] with new onset right-sided weakness when she woke up this morning. Patient reported she called her daughter who noticed that her mom has slurred speech and she advised her to go to ED for evaluation.  Pt was hemodynamically stable in ED. Stroke code called but cancelled subsequently. Pt outside of the treatment window. Her blood work was essentially unremarkable. CT head showed no acute intracranial findings. MRI showed acute CVA in pons and left cingulate gyrus.  Assessment & Plan:   Principal Problem:  Acute right-sided weakness / CVA (cerebral infarction), pons and left cingulate gyrus - Stroke work up initiated:  - Aspirin daily - MRI brain / MRA brain - small acute infarcts in the pons and left cingulate gyrus. Moderately advanced cerebral white matter disease for age, nonspecific. Considerations include chronic small vessel ischemia, neurosarcoidosis, other vasculitis, and demyelination. No evidence of dural or leptomeningeal sarcoid involvement. No evidence of major branch occlusion or significant proximal stenosis.  - CT head - no acute intracranial findings  - 2D ECHO - EF 60%, grade 1 diastolic dysfunction   - Carotid doppler - pending  - HgbA1c, Lipid panel - 147. LDL goal < 100. Patient on zocor 80 mg a day - UDS positive for THC - Diet: passed swallow screen - Therapy: PT/OT - CIR recommended, awaiting the insurance approval   Active Problems:  Benign essential HTN - Continue Hctz   Neuropathy (HCC) - Continue gabapentin   Dyslipidemia - Continue statin therapy     Tobacco use disorder / THC in urine drug screen - Nicotine patch  ordered - Counseled on cessation and drug abuse     DVT prophylaxis: SCD's bilaterally  Code Status: full code  Family Communication: daughter at the bedside this am Disposition Plan: awaiting CIR approval   Consultants:   Neurology  Procedures:   ECHO 04/24/2016 - EF 60%, grade 1 diastolic dysfunction   Carotid doppler   Antimicrobials:   None    Subjective: No overnight events.  Objective: Filed Vitals:   04/25/16 0108 04/25/16 0557 04/25/16 1011 04/25/16 1342  BP: 147/77 127/73 133/92 138/86  Pulse: 76 103 86 84  Temp: 98 F (36.7 C) 98.2 F (36.8 C) 98.3 F (36.8 C) 97.9 F (36.6 C)  TempSrc: Oral Oral Oral Oral  Resp: Height:      Weight:      SpO2: 100% 100% 99% 100%    Intake/Output Summary (Last 24 hours) at 04/25/16 1432 Last data filed at 04/25/16 1315  Gross per 24 hour  Intake    120 ml  Output      0 ml  Net    120 ml   Filed Weights   04/23/16 1659  Weight: 75.8 kg (167 lb 1.7 oz)    Examination:  General exam: Appears calm, no distress  Respiratory system: Respiratory effort normal. Cardiovascular system: S1 & S2 heard, Rate controlled  Gastrointestinal system: (+) BS, non tender  Central nervous system: Slurred speach Extremities: no edema, palpable pulses Skin: warm and dry Psychiatry: Mood & affect appropriate.   Data Reviewed: I have personally reviewed following labs and imaging studies  CBC:  Recent Labs Lab 04/23/16 1638 04/23/16 1650  WBC 7.0  --   NEUTROABS 3.7  --   HGB 14.6 15.3*  HCT 42.8 45.0  MCV 91.8  --   PLT 207  --    Basic Metabolic Panel:  Recent Labs Lab 04/23/16 1638 04/23/16 1650  NA 139 140  K 3.8 3.8  CL 105 105  CO2 26  --   GLUCOSE 73 69  BUN 10 10  CREATININE 0.73 0.70  CALCIUM 9.8  --    GFR: Estimated Creatinine Clearance: 72.9 mL/min (by C-G formula based on Cr of 0.7). Liver Function Tests:  Recent Labs Lab 04/23/16 1638  AST 24  ALT 18  ALKPHOS 83    BILITOT 0.6  PROT 8.0  ALBUMIN 4.1   No results for input(s): LIPASE, AMYLASE in the last 168 hours. No results for input(s): AMMONIA in the last 168 hours. Coagulation Profile:  Recent Labs Lab 04/23/16 1638  INR 1.09   Cardiac Enzymes: No results for input(s): CKTOTAL, CKMB, CKMBINDEX, TROPONINI in the last 168 hours. BNP (last 3 results) No results for input(s): PROBNP in the last 8760 hours. HbA1C: No results for input(s): HGBA1C in the last 72 hours. CBG: No results for input(s): GLUCAP in the last 168 hours. Lipid Profile:  Recent Labs  04/24/16 0228  CHOL 251*  HDL 54  LDLCALC 147*  TRIG 250*  CHOLHDL 4.6   Thyroid Function Tests: No results for input(s): TSH, T4TOTAL, FREET4, T3FREE, THYROIDAB in the last 72 hours. Anemia Panel: No results for input(s): VITAMINB12, FOLATE, FERRITIN, TIBC, IRON, RETICCTPCT in the last 72 hours. Urine analysis:    Component Value Date/Time   COLORURINE YELLOW 04/24/2016 0423   APPEARANCEUR CLOUDY* 04/24/2016 0423   LABSPEC 1.016 04/24/2016 0423   PHURINE 6.5 04/24/2016 0423   GLUCOSEU NEGATIVE 04/24/2016 0423   HGBUR NEGATIVE 04/24/2016 0423   BILIRUBINUR NEGATIVE 04/24/2016 0423   KETONESUR NEGATIVE 04/24/2016 0423   PROTEINUR NEGATIVE 04/24/2016 0423   NITRITE NEGATIVE 04/24/2016 0423   LEUKOCYTESUR TRACE* 04/24/2016 0423   Sepsis Labs: @LABRCNTIP (procalcitonin:4,lacticidven:4)   )No results found for this or any previous visit (from the past 240 hour(s)).    Radiology Studies: Ct Head Wo Contrast 04/23/2016  Normal CT evaluation of the brain.  Mr Lodema PilotBrain W Wo Contrast 04/24/2016   1. 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 2. Moderately advanced cerebral white matter disease for age, nonspecific. Considerations include chronic small vessel ischemia, neurosarcoidosis, other vasculitis, and demyelination. No evidence of dural or leptomeningeal sarcoid involvement. 3. No evidence of major branch  occlusion or significant proximal stenosis.   Mr Maxine GlennMra Head/brain Wo Cm 04/24/2016 1. 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 2. Moderately advanced cerebral white matter disease for age, nonspecific. Considerations include chronic small vessel ischemia, neurosarcoidosis, other vasculitis, and demyelination. No evidence of dural or leptomeningeal sarcoid involvement. 3. No evidence of major branch occlusion or significant proximal stenosis.     Scheduled Meds: . aspirin  300 mg Rectal Daily   Or  . aspirin  325 mg Oral Daily  . atorvastatin  40 mg Oral q1800  . doxepin  3 mg Oral QHS  . DULoxetine  60 mg Oral Daily  . famotidine  40 mg Oral Daily  . gabapentin  300 mg Oral QHS  . hydrochlorothiazide  12.5 mg Oral q morning - 10a  . nicotine  14 mg Transdermal Daily   Continuous Infusions:  LOS: 2 days    Time spent: 15 minutes  Greater than 50% of the time spent on counseling and coordinating the care.   Manson Passey, MD [facility] Pager [phone]  If 7PM-7AM, please contact night-coverage [email or web address] Password [facility] 04/25/2016, 2:32 PM

## 2016-04-25 NOTE — Interval H&P Note (Signed)
Sharon Roberson was admitted today to Inpatient Rehabilitation with the diagnosis of left pontine/cingulate gyrus infarct.  The patient's history has been reviewed, patient examined, and there is no change in status.  Patient continues to be appropriate for intensive inpatient rehabilitation.  I have reviewed the patient's chart and labs.  Questions were answered to the patient's satisfaction. The PAPE has been reviewed and assessment remains appropriate.  Salih Williamson T 04/25/2016, 7:47 PM

## 2016-04-25 NOTE — Progress Notes (Signed)
Physical Therapy Treatment Patient Details Name: Sharon Roberson MRN: [id number] DOB: May 19, 1966 Today's Date: 04/25/2016    History of Present Illness 50 y.o. female with medical history significant for hypertension, neuropathy, dyslipidemia who presented to [facility] with new onset right-sided weakness and slurred speech.     PT Comments    Patient fatigued from a busy morning and had just returned to bed on PT arrival. She was willing to work with PT without coaxing and just wanted me to know she was already tired. Follows commands well to make subtle changes in movement to strengthen and improve control of Rt knee for ambulation and reducing risk of falling.   Follow Up Recommendations  CIR     Equipment Recommendations  Other (comment) (TBA (currently refusing to use a device))    Recommendations for Other Services       Precautions / Restrictions Precautions Precautions: Fall Restrictions Weight Bearing Restrictions: No    Mobility  Bed Mobility Overal bed mobility: Needs Assistance Bed Mobility: Supine to Sit;Sit to Supine     Supine to sit: Min assist Sit to supine: Min guard   General bed mobility comments: HOB flat, no rail; patient maneuvering linens; extra time and effort; came to left side.   Transfers Overall transfer level: Needs assistance Equipment used: 1 person hand held assist Transfers: Sit to/from Stand Sit to Stand: Min assist         General transfer comment: from EOB; Rt knee unstable/weak with assist to prevent hyperextension; x2  Ambulation/Gait Ambulation/Gait assistance: Min assist Ambulation Distance (Feet): 50 Feet Assistive device: 1 person hand held assist (hall rail) Gait Pattern/deviations: Step-through pattern;Decreased stride length;Decreased dorsiflexion - right;Decreased weight shift to right;Decreased weight shift to left;Shuffle;Staggering right (rt knee instability; hyperextension)   Gait velocity interpretation: Below  normal speed for age/gender General Gait Details: Pt with knee hyperextension thrust RLE during stance phase due to right knee instability. Initially pt could not feel this; with facilitation she began to feel and control Rt knee better; Fatigued (had taken a shower and been up in chair all morning). Right lateral trunk lean with decreased arm swing.   Stairs            Wheelchair Mobility    Modified Rankin (Stroke Patients Only) Modified Rankin (Stroke Patients Only) Pre-Morbid Rankin Score: No symptoms Modified Rankin: Moderately severe disability     Balance Overall balance assessment: Needs assistance         Standing balance support: No upper extremity supported;During functional activity Standing balance-Leahy Scale: Poor Standing balance comment: min assist with imbalance                    Cognition Arousal/Alertness: Awake/alert (tired, just got back to bed) Behavior During Therapy: Flat affect;Impulsive Overall Cognitive Status: Impaired/Different from baseline Area of Impairment: Attention;Following commands;Safety/judgement;Problem solving;Awareness   Current Attention Level: Sustained (easily internally distracted) Memory: Decreased recall of precautions Following Commands: Follows one step commands inconsistently (due to decr attention; distractible) Safety/Judgement: Decreased awareness of safety;Decreased awareness of deficits Awareness: Intellectual Problem Solving: Difficulty sequencing;Slow processing;Requires verbal cues      Exercises Other Exercises Other Exercises: partial squats (lower 1/2 down, then return to standing) x5 with bil UE support. Sat to rest due to fatigue    General Comments        Pertinent Vitals/Pain Pain Assessment: No/denies pain Pain Score: 4  Pain Location: right side    Home Living     Available Help at  Discharge: Family;Available 24 hours/day Type of Home: House              Prior Function             PT Goals (current goals can now be found in the care plan section) Acute Rehab PT Goals Patient Stated Goal: home Time For Goal Achievement: 05/08/16 Progress towards PT goals: Progressing toward goals    Frequency  Min 4X/week    PT Plan Current plan remains appropriate    Co-evaluation             End of Session Equipment Utilized During Treatment: Gait belt Activity Tolerance: Patient limited by fatigue Patient left: in bed;with call bell/phone within reach;with bed alarm set;with family/visitor present     Time: [id number] PT Time Calculation (min) (ACUTE ONLY): 16 min  Charges:  $Gait Training: 8-22 mins                    G Codes:      Sharon Roberson 04/25/2016, 2:06 PM Pager [phone]

## 2016-04-26 ENCOUNTER — Inpatient Hospital Stay (HOSPITAL_COMMUNITY): Payer: Medicare Other | Admitting: Occupational Therapy

## 2016-04-26 ENCOUNTER — Inpatient Hospital Stay (HOSPITAL_COMMUNITY): Payer: Medicare Other | Admitting: *Deleted

## 2016-04-26 ENCOUNTER — Inpatient Hospital Stay (HOSPITAL_COMMUNITY): Payer: Medicare Other | Admitting: Speech Pathology

## 2016-04-26 MED ORDER — MUSCLE RUB 10-15 % EX CREA
TOPICAL_CREAM | Freq: Two times a day (BID) | CUTANEOUS | Status: DC | PRN
  Administered 2016-04-26: 1 via TOPICAL
  Filled 2016-04-26: qty 85

## 2016-04-26 MED ORDER — TROLAMINE SALICYLATE 10 % EX CREA
TOPICAL_CREAM | Freq: Two times a day (BID) | CUTANEOUS | Status: DC | PRN
Start: 2016-04-26 — End: 2016-04-26
  Filled 2016-04-26: qty 85

## 2016-04-26 MED ORDER — TRAMADOL HCL 50 MG PO TABS
50.0000 mg | ORAL_TABLET | Freq: Four times a day (QID) | ORAL | Status: DC | PRN
  Administered 2016-04-26 – 2016-04-29 (×8): 50 mg via ORAL
  Filled 2016-04-26 (×10): qty 1

## 2016-04-26 NOTE — Evaluation (Signed)
Physical Therapy Assessment and Plan  Patient Details  Name: Sharon Roberson MRN: [id number] Date of Birth: April 26, 1966  PT Diagnosis: Abnormality of gait Rehab Potential: Excellent ELOS: 5-7 days   Today's Date: 04/26/2016 PT Individual Time: 1300-1415 PT Individual Time Calculation (min): 75 min    Problem List:  Patient Active Problem List   Diagnosis Date Noted  . 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 Acute right-sided weakness 04/23/2016  . CVA (cerebral infarction) 04/23/2016  . Benign essential HTN 04/23/2016  . Neuropathy ([place]) 04/23/2016  . Dyslipidemia 04/23/2016  . Right sided weakness     Past Medical History:  Past Medical History  Diagnosis Date  . Hypertension   . Hypercholesteremia   . Sarcoidosis ([place])   . Pneumothorax    Past Surgical History: No past surgical history on file.  Assessment & Plan Clinical Impression: Sharon Roberson is a 50 y.o. right handed female with history of hypertension, hyperlipidemia, tobacco abuse. Per chart review patient lives with significant other versus friend. One level home. Independent prior to admission. Presented 04/23/2016 with slurred speech and right-sided weakness. Cranial CT scan negative. 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 No evidence of major occlusion or significant proximal stenosis per MRA. Patient did not receive TPA. Echocardiogram with ejection fraction of 18% grade 1 diastolic dysfunction. Carotid Dopplers with no ICA stenosis. Cardiology services consulted placed on aspirin for CVA prophylaxis. Tolerating a regular diet.Patient transferred to CIR on 04/25/2016 .   Patient currently requires min with mobility secondary to unbalanced muscle activation.  Prior to hospitalization, patient was independent  with mobility and lived with Significant other in a House home.  Home access is 3 at the back with one rail and 2 on front with 2B railsStairs to enter.  Patient will benefit from skilled  PT intervention to maximize safe functional mobility for planned discharge home with 24 hour supervision.  Anticipate patient will benefit from follow up [place] at discharge.  PT - End of Session Activity Tolerance: Tolerates 30+ min activity with multiple rests Endurance Deficit: Yes PT Assessment Rehab Potential (ACUTE/IP ONLY): Excellent Barriers to Discharge: Decreased caregiver support PT Patient demonstrates impairments in the following area(s): Balance;Endurance;Pain;Safety PT Transfers Functional Problem(s): Bed to Chair;Car PT Locomotion Functional Problem(s): Ambulation;Stairs PT Plan PT Intensity: Minimum of 1-2 x/day ,45 to 90 minutes PT Frequency: 5 out of 7 days PT Duration Estimated Length of Stay: 5-7 days PT Treatment/Interventions: Ambulation/gait training;Patient/family education;UE/LE Coordination activities;UE/LE Strength taining/ROM;Neuromuscular re-education;Therapeutic Exercise;Therapeutic Activities;Functional mobility training;Discharge planning PT Transfers Anticipated Outcome(s): Mod I to I for all mobility PT Locomotion Anticipated Outcome(s): Mod I to I for all mobility PT Recommendation Recommendations for Other Services: Neuropsych consult Follow Up Recommendations: Home health PT Patient destination: Home Equipment Recommended: To be determined  Skilled Therapeutic Intervention  Patient evaluation completed, with daughter present through the session. Patient complains of pain on R side, TTP even with slight touch. Bed mobility with Supervision, supine to sit with increased time to navigate R LE off bed but no physical assistance needed. Sit to stand with min A , and stabilizing during initial standing and during transfers. Patient with decreased awareness of her deficits, does not recognize the dangers of self transferring and gait, is very resistant to assistance provided from therapist initially, after in depth education allows to be assisted and verbalizes  need to ask for help with all transitional movements. Session focused on assesing mobility on level surfaces- HHA 1 x 80 feet  with min A, decreased step length and velocity. Stairs 1 x 4 with min A, manual assistance needed during descant from stairs. Transfer in and out of car at level of small SUV- min A. Training in w/c propulsion with increased need for cues.  Patient and daughter both in agreement with presented POC and goals.  At the end of session patient returned to bed and with all needs within reach.    PT Evaluation Precautions/Restrictions Precautions Precautions: Fall Restrictions Weight Bearing Restrictions: No General Chart Reviewed: Yes Family/Caregiver Present: Yes  Pain Pain Assessment Pain Assessment: 0-10 Pain Score: 7  Pain Type: Acute pain Pain Location: Other (Comment) (R side of the body -unable to define specific location) Pain Orientation: Right Pain Descriptors / Indicators: Constant Pain Onset: On-going Patients Stated Pain Goal: 0 Pain Intervention(s): Repositioned Multiple Pain Sites: No Home Living/Prior Functioning Home Living Available Help at Discharge: Family Type of Home: House Home Access: Stairs to enter [employer] of Steps: 3 at the back with one rail and 2 on front with 2B rails Entrance Stairs-Rails: Can reach both Home Layout: One level Bathroom Shower/Tub: Chiropodist: Standard Bathroom Accessibility: Yes  Lives With: Significant other Prior Function Level of Independence: Independent with basic ADLs;Independent with homemaking with ambulation  Able to Take Stairs?: Reciprically Driving: No Vocation: On disability Leisure: Hobbies-no  Cognition Overall Cognitive Status: Impaired/Different from baseline Arousal/Alertness: Awake/alert Orientation Level: Disoriented to time Attention: Sustained;Selective Focused Attention: Appears intact Sustained Attention: Appears intact Selective Attention:  Appears intact Memory: Impaired Memory Impairment: Decreased short term memory Awareness: Impaired Awareness Impairment: Emergent impairment Problem Solving: Impaired Problem Solving Impairment: Functional complex Safety/Judgment: Impaired Sensation Sensation Light Touch: Appears Intact Stereognosis: Appears Intact Hot/Cold: Appears Intact Proprioception: Appears Intact Coordination Gross Motor Movements are Fluid and Coordinated: No (right sided GM deficits ) Fine Motor Movements are Fluid and Coordinated: No (R sided FM deficits) Motor  Motor Motor: Within Functional Limits Motor - Skilled Clinical Observations: WFL during basic self care tasks  Mobility Bed Mobility Bed Mobility: Supine to Sit;Sit to Supine Supine to Sit: 4: Min guard Supine to Sit Details: Verbal cues for precautions/safety;Verbal cues for technique;Verbal cues for sequencing Sit to Supine: 4: Min guard Transfers Transfers: Yes Sit to Stand: 4: Min guard Sit to Stand Details: Verbal cues for technique;Verbal cues for sequencing;Visual cues/gestures for sequencing Stand to Sit: 4: Min guard Stand Pivot Transfers: 4: Min guard Locomotion  Ambulation Ambulation: Yes Ambulation/Gait Assistance: 4: Min assist Ambulation Distance (Feet): 80 Feet Assistive device: 1 person hand held assist Ambulation/Gait Assistance Details: Verbal cues for precautions/safety;Verbal cues for technique;Verbal cues for gait pattern Gait Gait: Yes Gait Pattern: Impaired Gait Pattern: Step-to pattern Gait velocity: decreased Stairs / Additional Locomotion Stairs: Yes Stairs Assistance: 4: Min assist Stairs Assistance Details: Verbal cues for precautions/safety;Manual facilitation for weight shifting Stair Management Technique: Two rails Number of Stairs: 4 Wheelchair Mobility Wheelchair Mobility: Yes Wheelchair Assistance: 4: Min Lexicographer: Both upper extremities Wheelchair Parts Management: Needs  assistance Distance: 100  Trunk/Postural Assessment  Cervical Assessment Cervical Assessment: Within Functional Limits Thoracic Assessment Thoracic Assessment: Within Functional Limits Lumbar Assessment Lumbar Assessment: Within Functional Limits Postural Control Postural Control: Within Functional Limits  Balance Balance Balance Assessed: Yes Standardized Balance Assessment Standardized Balance Assessment: Berg Balance Test Berg Balance Test Sit to Stand: Able to stand using hands after several tries Standing Unsupported: Able to stand 2 minutes with supervision Sitting with Back Unsupported but Feet Supported on Floor or  Stool: Able to sit safely and securely 2 minutes Stand to Sit: Uses backs of legs against chair to control descent Transfers: Able to transfer with verbal cueing and /or supervision Standing Unsupported with Eyes Closed: Able to stand 10 seconds with supervision Standing Ubsupported with Feet Together: Needs help to attain position and unable to hold for 15 seconds From Standing, Reach Forward with Outstretched Arm: Can reach forward >12 cm safely (5") From Standing Position, Pick up Object from Floor: Able to pick up shoe, needs supervision From Standing Position, Turn to Look Behind Over each Shoulder: Needs supervision when turning Turn 360 Degrees: Needs assistance while turning Standing Unsupported, Alternately Place Feet on Step/Stool: Able to complete 4 steps without aid or supervision Standing Unsupported, One Foot in Front: Loses balance while stepping or standing Standing on One Leg: Tries to lift leg/unable to hold 3 seconds but remains standing independently Total Score: 26 Static Sitting Balance Static Sitting - Level of Assistance: 5: Stand by assistance Dynamic Sitting Balance Dynamic Sitting - Level of Assistance: 5: Stand by assistance Static Standing Balance Static Standing - Balance Support: Right upper extremity supported Static Standing -  Level of Assistance: 4: Min assist Dynamic Standing Balance Dynamic Standing - Level of Assistance: 4: Min assist Extremity Assessment  RUE Assessment RUE Assessment: Exceptions to [facility] (AROM R UE shoulder flexion approximately 90 degrees; 4-/5 MMT of deltoids, biceps and triceps. 5/5 UE strength on L side.) LUE Assessment LUE Assessment: Within Functional Limits RLE Assessment RLE Assessment: Exceptions to [facility] RLE Strength RLE Overall Strength: Deficits     See Function Navigator for Current Functional Status.   Refer to Care Plan for Long Term Goals  Recommendations for other services: Neuropsych  Discharge Criteria: Patient will be discharged from PT if patient refuses treatment 3 consecutive times without medical reason, if treatment goals not met, if there is a change in medical status, if patient makes no progress towards goals or if patient is discharged from hospital.  The above assessment, treatment plan, treatment alternatives and goals were discussed and mutually agreed upon: by patient  [place] Spanish 04/26/2016, 3:58 PM

## 2016-04-26 NOTE — Evaluation (Signed)
Speech Language Pathology Assessment and Plan  Patient Details  Name: Sharon Roberson MRN: [id number] Date of Birth: 1966/05/27  SLP Diagnosis: Dysarthria;Cognitive Impairments  Rehab Potential: Excellent ELOS: 7 days for SLP    Today's Date: 04/26/2016 SLP Individual Time: 1000-1055 SLP Individual Time Calculation (min): 55 min   Problem List:  Patient Active Problem List   Diagnosis Date Noted  . 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 Acute right-sided weakness 04/23/2016  . CVA (cerebral infarction) 04/23/2016  . Benign essential HTN 04/23/2016  . Neuropathy ([place]) 04/23/2016  . Dyslipidemia 04/23/2016  . Right sided weakness    Past Medical History:  Past Medical History  Diagnosis Date  . Hypertension   . Hypercholesteremia   . Sarcoidosis ([place])   . Pneumothorax    Past Surgical History: No past surgical history on file.  Assessment / Plan / Recommendation Clinical Impression Patient is a 50 y.o. right handed female with history of hypertension, hyperlipidemia, tobacco abuse. Per chart review patient lives with significant other versus friend. One level home. Independent prior to admission. Presented 04/23/2016 with slurred speech and right-sided weakness. Urine drug screen positive marijuana. Cranial CT scan negative. 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 No evidence of major occlusion or significant proximal stenosis per MRA. Patient did not receive TPA. Echocardiogram with ejection fraction of 34% grade 1 diastolic dysfunction. Carotid Dopplers with no ICA stenosis. Cardiology services consulted placed on aspirin for CVA prophylaxis. Tolerating a regular diet. Physical therapy evaluation completed. Patient was admitted for comprehensive rehabilitation program on 04/25/16. Patient demonstrates mild cognitive impairments impacting recall and safety awareness and mild dysarthria due to incoordination of oral-motor musculature. Patient would benefit from  skilled SLP services to maximize her cognitive function and speech intelligibility and overall functional independence prior to discharge.   Skilled Therapeutic Interventions          Administered a cognitive-linguistic evaluation. Please see above for details. Patient completed a basic money management task with Mod I.    SLP Assessment  Patient will need skilled [place] [facility]ology Services during CIR admission    Recommendations  Patient destination: Home Follow up Recommendations:  (TBD) Equipment Recommended: None recommended by SLP    SLP Frequency 3 to 5 out of 7 days   SLP Duration  SLP Intensity  SLP Treatment/Interventions 7 days for SLP  Minumum of 1-2 x/day, 30 to 90 minutes  Cueing hierarchy;Cognitive remediation/compensation;Environmental controls;Internal/external aids;Functional tasks;Patient/family education;Therapeutic Activities;Speech/Language facilitation    Pain Pain Assessment Pain Assessment: 0-10 Pain Score: 7  Pain Type: Acute pain Pain Location: Other (Comment) (R side of the body -unable to define specific location) Pain Orientation: Right Pain Descriptors / Indicators: Constant Pain Onset: On-going Patients Stated Pain Goal: 0 Pain Intervention(s): Repositioned Multiple Pain Sites: No  Prior Functioning Type of Home: House  Lives With: Significant other Available Help at Discharge: Family Vocation: On disability  Function:   Cognition Comprehension Comprehension assist level: Understands basic 90% of the time/cues < 10% of the time  Expression   Expression assist level: Expresses basic 90% of the time/requires cueing < 10% of the time.  Social Interaction Social Interaction assist level: Interacts appropriately 90% of the time - Needs monitoring or encouragement for participation or interaction.  Problem Solving Problem solving assist level: Solves basic 90% of the time/requires cueing < 10% of the time  Memory Memory assist level:  Recognizes or recalls 50 - 74% of the time/requires cueing 25 - 49%  of the time   Short Term Goals: Week 1: SLP Short Term Goal 1 (Week 1): Patient will utilize speech intelligibility strategies at the conversation level with Mod I to achieve 100% intelligibility. SLP Short Term Goal 2 (Week 1): Patient will utilize external memory aids to recall new, daily information with supervision verbal cues.  SLP Short Term Goal 3 (Week 1): Patient will complete complex problem solving tasks with Mod I.   Refer to Care Plan for Long Term Goals  Recommendations for other services: None  Discharge Criteria: Patient will be discharged from SLP if patient refuses treatment 3 consecutive times without medical reason, if treatment goals not met, if there is a change in medical status, if patient makes no progress towards goals or if patient is discharged from hospital.  The above assessment, treatment plan, treatment alternatives and goals were discussed and mutually agreed upon: by patient  Sharon Roberson 04/26/2016, 4:02 PM

## 2016-04-26 NOTE — Progress Notes (Addendum)
50 y.o. right handed female with history of hypertension, hyperlipidemia, tobacco abuse. Per chart review patient lives with significant other versus friend. One level home. Independent prior to admission. Presented 04/23/2016 with slurred speech and right-sided weakness. Urine drug screen positive marijuana. Cranial CT scan negative. MRI showed small acute infarcts in the pons and left cingulate gyrus  Subjective/Complaints: Right sided low back pain Hx kidney stone, no dysuria, no sweats or chills  ROS- no N/V/D  Objective: Vital Signs: Blood pressure 117/76, pulse 77, temperature 97.7 F (36.5 C), temperature source Oral, resp. rate 19, height _0  (1.473 m), weight 75.8 kg (167 lb 1.7 oz), SpO2 100 %. Mr Jeri Cos Wo Contrast  04/24/2016  CLINICAL DATA:  New onset right-sided weakness. Slurred speech. History of hypertension, hypercholesterolemia, and sarcoidosis. EXAM: MRI HEAD WITHOUT AND WITH CONTRAST MRA HEAD WITHOUT CONTRAST TECHNIQUE: Multiplanar, multiecho pulse sequences of the brain and surrounding structures were obtained without and with intravenous contrast. Angiographic images of the head were obtained using MRA technique without contrast. CONTRAST:  92m MULTIHANCE GADOBENATE DIMEGLUMINE 529 MG/ML IV SOLN COMPARISON:  Head CT 04/23/2016 FINDINGS: MRI HEAD FINDINGS There is a 9 mm acute infarct in the left cingulate gyrus. There is also a 1.2 cm acute infarct in the left paramedian pons. There is no evidence of intracranial hemorrhage, mass, midline shift, or extra-axial fluid collection. The ventricles and sulci are normal. Patchy T2 hyperintensities are present in the predominantly deep cerebral white matter bilaterally and are moderately advanced for age. No abnormal brain parenchymal or meningeal enhancement is identified. Orbits are unremarkable. No significant inflammatory disease is seen in the paranasal sinuses are mastoid air cells. Major intracranial vascular flow voids are  preserved. MRA HEAD FINDINGS There is mild motion artifact. The visualized distal vertebral arteries are patent with the left being minimally larger than the right. Left PICA origin is patent. The right PICA origin was not imaged. SCA origins are patent. Basilar artery is widely patent. Posterior communicating arteries are not identified. PCAs are patent without evidence of significant proximal stenosis. The internal carotid arteries are patent from skullbase to carotid termini. Focal areas diminished signal the in the proximal right petrous ICA is favored to be artifactual, with less prominent artifact on the contralateral side at the same level. The ACAs and MCAs are patent without evidence of major branch occlusion or significant proximal stenosis. No intracranial aneurysm is identified. IMPRESSION: 1. 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 2. Moderately advanced cerebral white matter disease for age, nonspecific. Considerations include chronic small vessel ischemia, neurosarcoidosis, other vasculitis, and demyelination. No evidence of dural or leptomeningeal sarcoid involvement. 3. No evidence of major branch occlusion or significant proximal stenosis. Electronically Signed   By: ALogan BoresM.D.   On: 04/24/2016 09:51   Mr MJodene NamHead/brain Wo Cm  04/24/2016  CLINICAL DATA:  New onset right-sided weakness. Slurred speech. History of hypertension, hypercholesterolemia, and sarcoidosis. EXAM: MRI HEAD WITHOUT AND WITH CONTRAST MRA HEAD WITHOUT CONTRAST TECHNIQUE: Multiplanar, multiecho pulse sequences of the brain and surrounding structures were obtained without and with intravenous contrast. Angiographic images of the head were obtained using MRA technique without contrast. CONTRAST:  170mMULTIHANCE GADOBENATE DIMEGLUMINE 529 MG/ML IV SOLN COMPARISON:  Head CT 04/23/2016 FINDINGS: MRI HEAD FINDINGS There is a 9 mm acute infarct in the left cingulate gyrus. There is also a 1.2 cm acute  infarct in the left paramedian pons. There is no evidence of intracranial hemorrhage, mass, midline  shift, or extra-axial fluid collection. The ventricles and sulci are normal. Patchy T2 hyperintensities are present in the predominantly deep cerebral white matter bilaterally and are moderately advanced for age. No abnormal brain parenchymal or meningeal enhancement is identified. Orbits are unremarkable. No significant inflammatory disease is seen in the paranasal sinuses are mastoid air cells. Major intracranial vascular flow voids are preserved. MRA HEAD FINDINGS There is mild motion artifact. The visualized distal vertebral arteries are patent with the left being minimally larger than the right. Left PICA origin is patent. The right PICA origin was not imaged. SCA origins are patent. Basilar artery is widely patent. Posterior communicating arteries are not identified. PCAs are patent without evidence of significant proximal stenosis. The internal carotid arteries are patent from skullbase to carotid termini. Focal areas diminished signal the in the proximal right petrous ICA is favored to be artifactual, with less prominent artifact on the contralateral side at the same level. The ACAs and MCAs are patent without evidence of major branch occlusion or significant proximal stenosis. No intracranial aneurysm is identified. IMPRESSION: 1. 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 2. Moderately advanced cerebral white matter disease for age, nonspecific. Considerations include chronic small vessel ischemia, neurosarcoidosis, other vasculitis, and demyelination. No evidence of dural or leptomeningeal sarcoid involvement. 3. No evidence of major branch occlusion or significant proximal stenosis. Electronically Signed   By: Logan Bores M.D.   On: 04/24/2016 09:51   Results for orders placed or performed during the hospital encounter of 04/23/16 (from the past 72 hour(s))  Ethanol     Status: None    Collection Time: 04/23/16  4:38 PM  Result Value Ref Range   Alcohol, Ethyl (B) <5 <5 mg/dL    Comment:        LOWEST DETECTABLE LIMIT FOR SERUM ALCOHOL IS 5 mg/dL FOR MEDICAL PURPOSES ONLY   Protime-INR     Status: None   Collection Time: 04/23/16  4:38 PM  Result Value Ref Range   Prothrombin Time 14.3 11.6 - 15.2 seconds   INR 1.09 0.00 - 1.49  APTT     Status: None   Collection Time: 04/23/16  4:38 PM  Result Value Ref Range   aPTT 27 24 - 37 seconds  CBC     Status: None   Collection Time: 04/23/16  4:38 PM  Result Value Ref Range   WBC 7.0 4.0 - 10.5 K/uL   RBC 4.66 3.87 - 5.11 MIL/uL   Hemoglobin 14.6 12.0 - 15.0 g/dL   HCT 42.8 36.0 - 46.0 %   MCV 91.8 78.0 - 100.0 fL   MCH 31.3 26.0 - 34.0 pg   MCHC 34.1 30.0 - 36.0 g/dL   RDW 13.3 11.5 - 15.5 %   Platelets 207 150 - 400 K/uL  Differential     Status: None   Collection Time: 04/23/16  4:38 PM  Result Value Ref Range   Neutrophils Relative % 53 %   Neutro Abs 3.7 1.7 - 7.7 K/uL   Lymphocytes Relative 35 %   Lymphs Abs 2.5 0.7 - 4.0 K/uL   Monocytes Relative 10 %   Monocytes Absolute 0.7 0.1 - 1.0 K/uL   Eosinophils Relative 2 %   Eosinophils Absolute 0.1 0.0 - 0.7 K/uL   Basophils Relative 0 %   Basophils Absolute 0.0 0.0 - 0.1 K/uL  Comprehensive metabolic panel     Status: None   Collection Time: 04/23/16  4:38 PM  Result Value  Ref Range   Sodium 139 135 - 145 mmol/L   Potassium 3.8 3.5 - 5.1 mmol/L   Chloride 105 101 - 111 mmol/L   CO2 26 22 - 32 mmol/L   Glucose, Bld 73 65 - 99 mg/dL   BUN 10 6 - 20 mg/dL   Creatinine, Ser 0.73 0.44 - 1.00 mg/dL   Calcium 9.8 8.9 - 10.3 mg/dL   Total Protein 8.0 6.5 - 8.1 g/dL   Albumin 4.1 3.5 - 5.0 g/dL   AST 24 15 - 41 U/L   ALT 18 14 - 54 U/L   Alkaline Phosphatase 83 38 - 126 U/L   Total Bilirubin 0.6 0.3 - 1.2 mg/dL   GFR calc non Af Amer >60 >60 mL/min   GFR calc Af Amer >60 >60 mL/min    Comment: (NOTE) The eGFR has been calculated using the CKD EPI  equation. This calculation has not been validated in all clinical situations. eGFR's persistently <60 mL/min signify possible Chronic Kidney Disease.    Anion gap 8 5 - 15  I-stat troponin, ED (not at [facility], [facility])     Status: None   Collection Time: 04/23/16  4:48 PM  Result Value Ref Range   Troponin i, poc 0.00 0.00 - 0.08 ng/mL   Comment 3            Comment: Due to the release kinetics of cTnI, a negative result within the first hours of the onset of symptoms does not rule out myocardial infarction with certainty. If myocardial infarction is still suspected, repeat the test at appropriate intervals.   I-Stat Chem 8, ED  (not at [facility], [facility])     Status: Abnormal   Collection Time: 04/23/16  4:50 PM  Result Value Ref Range   Sodium 140 135 - 145 mmol/L   Potassium 3.8 3.5 - 5.1 mmol/L   Chloride 105 101 - 111 mmol/L   BUN 10 6 - 20 mg/dL   Creatinine, Ser 0.70 0.44 - 1.00 mg/dL   Glucose, Bld 69 65 - 99 mg/dL   Calcium, Ion 1.09 (L) 1.13 - 1.30 mmol/L   TCO2 23 0 - 100 mmol/L   Hemoglobin 15.3 (H) 12.0 - 15.0 g/dL   HCT 45.0 36.0 - 46.0 %  Lipid panel     Status: Abnormal   Collection Time: 04/24/16  2:28 AM  Result Value Ref Range   Cholesterol 251 (H) 0 - 200 mg/dL   Triglycerides 250 (H) <150 mg/dL   HDL 54 >40 mg/dL   Total CHOL/HDL Ratio 4.6 RATIO   VLDL 50 (H) 0 - 40 mg/dL   LDL Cholesterol 147 (H) 0 - 99 mg/dL    Comment:        Total Cholesterol/HDL:CHD Risk Coronary Heart Disease Risk Table                     Men   Women  1/2 Average Risk   3.4   3.3  Average Risk       5.0   4.4  2 X Average Risk   9.6   7.1  3 X Average Risk  23.4   11.0        Use the calculated Patient Ratio above and the CHD Risk Table to determine the patient's CHD Risk.        ATP III CLASSIFICATION (LDL):  <100     mg/dL   Optimal  100-129  mg/dL   Near or Above  Optimal  130-159  mg/dL   Borderline  160-189  mg/dL   High  >190     mg/dL   Very High    Urine rapid drug screen (hosp performed)not at [facility]     Status: Abnormal   Collection Time: 04/24/16  4:23 AM  Result Value Ref Range   Opiates NONE DETECTED NONE DETECTED   Cocaine NONE DETECTED NONE DETECTED   Benzodiazepines NONE DETECTED NONE DETECTED   Amphetamines NONE DETECTED NONE DETECTED   Tetrahydrocannabinol POSITIVE (A) NONE DETECTED   Barbiturates NONE DETECTED NONE DETECTED    Comment:        DRUG SCREEN FOR MEDICAL PURPOSES ONLY.  IF CONFIRMATION IS NEEDED FOR ANY PURPOSE, NOTIFY LAB WITHIN 5 DAYS.        LOWEST DETECTABLE LIMITS FOR URINE DRUG SCREEN Drug Class       Cutoff (ng/mL) Amphetamine      1000 Barbiturate      200 Benzodiazepine   588 Tricyclics       502 Opiates          300 Cocaine          300 THC              50   Urinalysis, Routine w reflex microscopic (not at [facility])     Status: Abnormal   Collection Time: 04/24/16  4:23 AM  Result Value Ref Range   Color, Urine YELLOW YELLOW   APPearance CLOUDY (A) CLEAR   Specific Gravity, Urine 1.016 1.005 - 1.030   pH 6.5 5.0 - 8.0   Glucose, UA NEGATIVE NEGATIVE mg/dL   Hgb urine dipstick NEGATIVE NEGATIVE   Bilirubin Urine NEGATIVE NEGATIVE   Ketones, ur NEGATIVE NEGATIVE mg/dL   Protein, ur NEGATIVE NEGATIVE mg/dL   Nitrite NEGATIVE NEGATIVE   Leukocytes, UA TRACE (A) NEGATIVE  Urine microscopic-add on     Status: Abnormal   Collection Time: 04/24/16  4:23 AM  Result Value Ref Range   Squamous Epithelial / LPF 0-5 (A) NONE SEEN   WBC, UA 0-5 0 - 5 WBC/hpf   RBC / HPF NONE SEEN 0 - 5 RBC/hpf   Bacteria, UA RARE (A) NONE SEEN   Urine-Other AMORPHOUS URATES/PHOSPHATES      HEENT: normal Cardio: RRR and no murmur Resp: CTA B/L and unlabored GI: BS positive and NT/ND Extremity:  Pulses positive and No Edema Skin:   Intact Neuro: Alert/Oriented and Abnormal Motor Right HP Musc/Skel:  Other right flank tenderness over posterior rib, pain with deep inspiration, no mass or defromity Gen  NAD   Assessment/Plan: 1. 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 Physiatrist is providing close team supervision and 24 hour management of active medical problems listed below. Physiatrist and rehab team continue to assess barriers to discharge/monitor patient progress toward functional and medical goals. FIM:                                  Medical Problem List and Plan: 1.  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  DVT Prophylaxis/Anticoagulation: SCDs. Monitor for any signs of DVT 3. Pain Management: Neurontin 300 mg daily at bedtime, flank pain check  Kpad , start sportscreme, check UA 4. Mood. Sinequan 10 mg daily at bedtime, Cymbalta 60 mg daily. 5. Neuropsych: This patient is capable of making decisions on his own behalf. 6. Skin/Wound Care: Routine skin checks 7. Fluids/Electrolytes/Nutrition: Routine I&O with follow-up chemistries upon admit 8. Hypertension. Hydrochlorothiazide 12.5 mg daily. Monitor for hypotension and orthostasis  Filed Vitals:   04/25/16 1843 04/26/16 0514  BP: 145/76 117/76  Pulse: 100 77  Temp: 98 F (36.7 C) 97.7 F (36.5 C)  Resp: 20 19    9. Hyperlipidemia. Lipitor 10. Tobacco abuse/marijuana. Nicoderm patch. Provide counseling   LOS (Days) 1 A FACE TO FACE EVALUATION WAS PERFORMED  Aiman Noe E 04/26/2016, 8:24 AM

## 2016-04-26 NOTE — Progress Notes (Signed)
Occupational Therapy Assessment and Plan  Patient Details  Name: Sharon Roberson MRN: [id number] Date of Birth: 1966/04/01  OT Diagnosis: flaccid hemiplegia and hemiparesis and muscle weakness (generalized) Rehab Potential: Rehab Potential (ACUTE ONLY): Good ELOS: 5-7 days   Today's Date: 04/26/2016 OT Individual Time: 1443-1540 OT Individual Time Calculation (min): 60 min     Problem List:  Patient Active Problem List   Diagnosis Date Noted  . 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 Acute right-sided weakness 04/23/2016  . CVA (cerebral infarction) 04/23/2016  . Benign essential HTN 04/23/2016  . Neuropathy ([place]) 04/23/2016  . Dyslipidemia 04/23/2016  . Right sided weakness     Past Medical History:  Past Medical History  Diagnosis Date  . Hypertension   . Hypercholesteremia   . Sarcoidosis ([place])   . Pneumothorax    Past Surgical History: No past surgical history on file.  Assessment & Plan Clinical Impression:  Sharon Roberson is a 49 y.o. right handed female with history of hypertension, hyperlipidemia, tobacco abuse. Per chart review patient lives with significant other versus friend. One level home. Independent prior to admission. Presented 04/23/2016 with slurred speech and right-sided weakness. Urine drug screen positive marijuana. Cranial CT scan negative. 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 No evidence of major occlusion or significant proximal stenosis per MRA. Patient did not receive TPA. Echocardiogram with ejection fraction of 08% grade 1 diastolic dysfunction. Carotid Dopplers with no ICA stenosis. Cardiology services consulted placed on aspirin for CVA prophylaxis. Tolerating a regular diet.  Patient currently requires min A with basic self-care skills secondary to muscle weakness and unbalanced muscle activation and decreased coordination.  Prior to hospitalization, patient could complete BADLs with complete independence.   Patient will  benefit from skilled intervention to increase independence with basic self-care skills prior to discharge home with care partner.  Anticipate patient will require intermittent supervision and follow up outpatient.  OT - End of Session Activity Tolerance: Tolerates 30+ min activity without fatigue Endurance Deficit: Yes OT Assessment Rehab Potential (ACUTE ONLY): Good Barriers to Discharge: Inaccessible home environment OT Patient demonstrates impairments in the following area(s): Balance;Cognition;Endurance;Motor;Safety;Vision OT Basic ADL's Functional Problem(s): Grooming;Bathing;Dressing;Toileting OT Advanced ADL's Functional Problem(s): Simple Meal Preparation;Laundry;Light Housekeeping OT Transfers Functional Problem(s): Tub/Shower OT Additional Impairment(s): Fuctional Use of Upper Extremity OT Plan OT Intensity: Minimum of 1-2 x/day, 45 to 90 minutes OT Frequency: 5 out of 7 days OT Duration/Estimated Length of Stay: 5-7 days OT Treatment/Interventions: Balance/vestibular training;Cognitive remediation/compensation;Community reintegration;Discharge planning;Disease mangement/prevention;Neuromuscular re-education;Functional mobility training;DME/adaptive equipment instruction;Patient/family education;Therapeutic Activities;Psychosocial support;Therapeutic Exercise;Self Care/advanced ADL retraining;UE/LE Strength taining/ROM;UE/LE Coordination activities OT Self Feeding Anticipated Outcome(s): Mod I-Independent OT Basic Self-Care Anticipated Outcome(s): Supervision-Independent OT Toileting Anticipated Outcome(s): Mod I  OT Bathroom Transfers Anticipated Outcome(s): Supervision  OT Recommendation Recommendations for Other Services: Neuropsych consult Patient destination: Home Follow Up Recommendations: Home health OT;Outpatient OT Equipment Recommended: To be determined   Skilled Therapeutic Intervention Pt seen for skilled OT eval of BADL completion upon admission to CIR. Pt was  educated on role of OT and POC with pt and daughter verbalized understanding. Due to black buildup on floor of shower, pt completed bathing in chair at sink. Maintenance was notified and attended to shower during session. Pt completed sit to stand with Min A, and ambulated to chair without device with HHA and Min A. R UE deficits assessed via MMT. Slight gross motor and mod fine motor deficits observed during self care completion. Pt completed UB ADLs with  mod cuing to integrate R UE as gross stabilizer with facilitation of WB. Pt and daughter were provided education on hemiparesis with encouragement to use affected UE during functional tasks in room in order to regain function. Pt and daughter verbalized understanding. Pt completed LB ADLs with steadying assistance for balance. Slight hyperextension of R knee observed but no LOBs occurred during session. Grooming and oral care tasks completed while standing with Min A. Pt was provided education on OT POC with active collaboration regarding goal establishment. At end of session, pt left with daughter for breakfast in chair. No c/o pain today.   OT Evaluation Precautions/Restrictions  Precautions Precautions: Fall Restrictions Weight Bearing Restrictions: No General Chart Reviewed: Yes Vital Signs   Pain Assessment Pain Assessment: 0-10 Pain Score: 7  Pain Type: Acute pain Pain Location: Other (Comment) (R side of the body -unable to define specific location) Pain Orientation: Right Pain Descriptors / Indicators: Constant Pain Onset: On-going Patients Stated Pain Goal: 0 Pain Intervention(s): Repositioned Multiple Pain Sites: No Home Living/Prior Functioning Home Living Available Help at Discharge: Family Type of Home: House Home Access: Stairs to enter [employer] of Steps: 3 at the back with one rail and 2 on front with 2B rails Entrance Stairs-Rails: Can reach both Home Layout: One level Bathroom Shower/Tub: Research officer, political party: Yes  Lives With: Significant other IADL History Homemaking Responsibilities: Yes Meal Prep Responsibility: Primary Laundry Responsibility: Primary Cleaning Responsibility: Primary Bill Paying/Finance Responsibility: No Shopping Responsibility: No Child Care Responsibility: No Current License: No Type of Occupation: Disabled  Leisure and Hobbies: Reading  Prior Function Level of Independence: Independent with basic ADLs, Independent with homemaking with ambulation  Able to Take Stairs?: Reciprically Driving: No Vocation: On disability Leisure: Hobbies-no   Vision/Perception  Vision- History Baseline Vision/History: Glaucoma Patient Visual Report: No change from baseline Vision- Assessment Vision Assessment?: No apparent visual deficits  Cognition Overall Cognitive Status: Within Functional Limits for tasks assessed Arousal/Alertness: Awake/alert Orientation Level: Person;Situation Year: 2017 Month: July Day of Week: Correct Memory: Appears intact Immediate Memory Recall: Blue;Bed;Sock Memory Recall: Sock;Blue;Bed Memory Recall Sock: With Cue Memory Recall Blue: Without Cue Memory Recall Bed: Without Cue Attention: Focused Focused Attention: Appears intact Awareness: Appears intact Problem Solving: Appears intact Safety/Judgment: Impaired Sensation Sensation Light Touch: Appears Intact Stereognosis: Appears Intact Hot/Cold: Appears Intact Proprioception: Appears Intact Coordination Gross Motor Movements are Fluid and Coordinated: No (right sided GM deficits ) Fine Motor Movements are Fluid and Coordinated: No (R sided FM deficits) Motor  Motor Motor: Within Functional Limits Motor - Skilled Clinical Observations: WFL during basic self care tasks Mobility  Bed Mobility Bed Mobility: Supine to Sit Supine to Sit: 4: Min assist Supine to Sit Details: Verbal cues for precautions/safety;Verbal cues for  technique;Verbal cues for sequencing Transfers Transfers: Sit to Stand Sit to Stand: 4: Min assist Sit to Stand Details: Verbal cues for technique;Verbal cues for sequencing;Visual cues/gestures for sequencing  Trunk/Postural Assessment  Cervical Assessment Cervical Assessment: Within Functional Limits Thoracic Assessment Thoracic Assessment: Within Functional Limits Lumbar Assessment Lumbar Assessment: Within Functional Limits Postural Control Postural Control: Within Functional Limits  Balance Balance Balance Assessed: Yes Standardized Balance Assessment Standardized Balance Assessment: Berg Balance Test Berg Balance Test Sit to Stand: Able to stand using hands after several tries Standing Unsupported: Able to stand 2 minutes with supervision Sitting with Back Unsupported but Feet Supported on Floor or Stool: Able to sit safely and securely 2 minutes Stand to Sit: Uses backs of  legs against chair to control descent Transfers: Needs one person to assist Standing Unsupported with Eyes Closed: Able to stand 10 seconds with supervision Standing Ubsupported with Feet Together: Able to place feet together independently but unable to hold for 30 seconds From Standing, Reach Forward with Outstretched Arm: Can reach forward >12 cm safely (5") From Standing Position, Pick up Object from Floor: Unable to pick up and needs supervision From Standing Position, Turn to Look Behind Over each Shoulder: Needs supervision when turning Turn 360 Degrees: Needs assistance while turning Standing Unsupported, Alternately Place Feet on Step/Stool: Able to complete >2 steps/needs minimal assist Standing Unsupported, One Foot in Front: Needs help to step but can hold 15 seconds Standing on One Leg: Unable to try or needs assist to prevent fall Total Score: 24 Static Sitting Balance Static Sitting - Level of Assistance: 5: Stand by assistance Dynamic Sitting Balance Dynamic Sitting - Level of Assistance:  5: Stand by assistance Static Standing Balance Static Standing - Balance Support: Right upper extremity supported Static Standing - Level of Assistance: 4: Min assist Dynamic Standing Balance Dynamic Standing - Level of Assistance: 4: Min assist Extremity/Trunk Assessment RUE Assessment RUE Assessment: Exceptions to [facility] (AROM R UE shoulder flexion approximately 90 degrees; 4-/5 MMT of deltoids, biceps and triceps. 5/5 UE strength on L side.) LUE Assessment LUE Assessment: Within Functional Limits   See Function Navigator for Current Functional Status.   Refer to Care Plan for Long Term Goals  Recommendations for other services: Neuropsych  Discharge Criteria: Patient will be discharged from OT if patient refuses treatment 3 consecutive times without medical reason, if treatment goals not met, if there is a change in medical status, if patient makes no progress towards goals or if patient is discharged from hospital.  The above assessment, treatment plan, treatment alternatives and goals were discussed and mutually agreed upon: by patient and by family  Skeet Simmer 04/26/2016, 3:43 PM

## 2016-04-27 ENCOUNTER — Inpatient Hospital Stay (HOSPITAL_COMMUNITY): Payer: Medicare Other | Admitting: Physical Therapy

## 2016-04-27 DIAGNOSIS — G8191 Hemiplegia, unspecified affecting right dominant side: Secondary | ICD-10-CM

## 2016-04-27 LAB — BASIC METABOLIC PANEL
ANION GAP: 12 (ref 5–15)
BUN: 13 mg/dL (ref 6–20)
CALCIUM: 10 mg/dL (ref 8.9–10.3)
CO2: 24 mmol/L (ref 22–32)
Chloride: 103 mmol/L (ref 101–111)
Creatinine, Ser: 0.91 mg/dL (ref 0.44–1.00)
Glucose, Bld: 96 mg/dL (ref 65–99)
Potassium: 3.7 mmol/L (ref 3.5–5.1)
Sodium: 139 mmol/L (ref 135–145)

## 2016-04-27 LAB — URINALYSIS, ROUTINE W REFLEX MICROSCOPIC
GLUCOSE, UA: NEGATIVE mg/dL
Hgb urine dipstick: NEGATIVE
KETONES UR: NEGATIVE mg/dL
LEUKOCYTES UA: NEGATIVE
NITRITE: NEGATIVE
PROTEIN: NEGATIVE mg/dL
Specific Gravity, Urine: 1.024 (ref 1.005–1.030)
pH: 6 (ref 5.0–8.0)

## 2016-04-27 LAB — CBC
HCT: 43.1 % (ref 36.0–46.0)
Hemoglobin: 14.8 g/dL (ref 12.0–15.0)
MCH: 31.7 pg (ref 26.0–34.0)
MCHC: 34.3 g/dL (ref 30.0–36.0)
MCV: 92.3 fL (ref 78.0–100.0)
PLATELETS: 201 10*3/uL (ref 150–400)
RBC: 4.67 MIL/uL (ref 3.87–5.11)
RDW: 13.1 % (ref 11.5–15.5)
WBC: 7.3 10*3/uL (ref 4.0–10.5)

## 2016-04-27 MED ORDER — SODIUM CHLORIDE 0.9 % IV SOLN
INTRAVENOUS | Status: DC
  Administered 2016-04-27: 06:00:00 via INTRAVENOUS

## 2016-04-27 NOTE — Progress Notes (Signed)
Physical Therapy Session Note  Patient Details  Name: Sharon Roberson MRN: [id number] Date of Birth: [birth year]  Today's Date: 04/27/2016 PT Individual Time: 1435-1505 PT Individual Time Calculation (min): 30 min    Short Term Goals: Week 1:  PT Short Term Goal 1 (Week 1): STG=LTG  Skilled Therapeutic Interventions/Progress Updates:   Pt received supine in bed, denies pain and agreeable to treatment. While therapist walked out to request nursing disconnect IV, pt had boyfriend Mitch don shoes totalA reporting "I can't put on my shoes by myself!". Gait to gym with no AD and min guard x160'. Pt with slow speed, forward flexed posture to look at feet despite cueing to look up, and poor R knee control with variable recurvatum/buckling however able to regain balance without A when knee buckles. Pt verbalizes goal of leaving rehab without w/c or other gait AD. Standing on kinetron x3 trials with LLE bent and weight shift to R side to maintain stance; min verbal/tactile cues for "unlocking" R knee; pt very fearful and demonstrates forward flexed posture, leaning onto machine with R hip, however improved with repetition and multimodal cueing. Returned to room in w/c totalA; remained seated on EOB with boyfriend and NT present, all needs in reach.   Therapy Documentation Precautions:  Precautions Precautions: Fall Restrictions Weight Bearing Restrictions: No Pain: Pain Assessment Pain Assessment: No/denies pain   See Function Navigator for Current Functional Status.   Therapy/Group: Individual Therapy  Vista Lawman 04/27/2016, 3:13 PM

## 2016-04-27 NOTE — Plan of Care (Signed)
Problem: RH BOWEL ELIMINATION Goal: RH STG MANAGE BOWEL WITH ASSISTANCE STG Manage Bowel with mini assist assistance.  Outcome: Not Progressing Sorbitol given

## 2016-04-27 NOTE — Progress Notes (Addendum)
Pt daughter called nurse to room stating something was wrong with her mom. Reported to room and pt seem lethargic, diaphoretic and pulling clothes off, and unable to answer all of my questions fully. Attempted to take VS T98.5, P69, BP 97/67, RR22. Began to lower the [facility] when patient suddenly became nauseated and started vomiting. Emesis was moderate amount of chunky white/green consistent of food patient ate the night before. Daughter stated pt woke up out of sleep like this, pulling off her clothes saying she was hot. Zofran 4mg  IM was given at 0452. MD on call was notified at 0510 of recent events of the pt and orders given and ordered as given. Will continue to monitor pt. Latricia Cerrito, Phill Mutter  Pt began to become more alert and responding appropriately at 520. No reports of being hot anymore but complains of a sever headache to L side of head reaching to her neck. MD notified of h/a also. Repostioned in bed and gown and linen were changed. Will cont to monitor. Maylon Sailors, Phill Mutter

## 2016-04-27 NOTE — Progress Notes (Signed)
50 y.o. right handed female with history of hypertension, hyperlipidemia, tobacco abuse. Per chart review patient lives with significant other versus friend. One level home. Independent prior to admission. Presented 04/23/2016 with slurred speech and right-sided weakness. Urine drug screen positive marijuana. Cranial CT scan negative. MRI showed small acute infarcts in the pons and left cingulate gyrus  Subjective/Complaints: Woke up with nausea and vomiting.  No abd pain no diarrhea.  Denies Sweats or chills.  Had left side headache.   Back pain from yesterday subsided  PMH + kidney stone  ROS- no N/V/D  Objective: Vital Signs: Blood pressure 97/67, pulse 69, temperature 98.5 F (36.9 C), temperature source Oral, resp. rate (!) 22, height  (1.473 m), weight 75.8 kg (167 lb 1.7 oz), SpO2 100 %. No results found. No results found for this or any previous visit (from the past 72 hour(s)).   HEENT: normal, no nystagmus Cardio: RRR and no murmur Resp: CTA B/L and unlabored GI: BS positive and NT/ND Extremity:  Pulses positive and No Edema Skin:   Intact Neuro: Alert/Oriented and Abnormal Motor Right HP, 3- in RUE and RLE except trace R ADF,  Left side 4/5 in UE and LE, no evidence of dysmetria on left, right limited by strength Musc/Skel:  No pain with UE or LE ROM Gen NAD   Assessment/Plan: 1. 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 Physiatrist is providing close team supervision and 24 hour management of active medical problems listed below. Physiatrist and rehab team continue to assess barriers to discharge/monitor patient progress toward functional and medical goals. FIM: Function - Bathing Position: Wheelchair/chair at sink Body parts bathed by patient: Right arm, Left arm, Abdomen, Chest, Front perineal area,  Buttocks, Right upper leg, Left upper leg, Right lower leg, Left lower leg Body parts bathed by helper: Back Assist Level: Touching or steadying assistance(Pt > 75%)  Function- Upper Body Dressing/Undressing What is the patient wearing?: Pull over shirt/dress, Bra Bra - Perfomed by patient: Thread/unthread right bra strap, Thread/unthread left bra strap, Hook/unhook bra (pull down sports bra) Pull over shirt/dress - Perfomed by patient: Thread/unthread right sleeve, Thread/unthread left sleeve, Put head through opening, Pull shirt over trunk Assist Level: Supervision or verbal cues  Function - Toileting Toileting steps completed by patient: Adjust clothing prior to toileting, Performs perineal hygiene, Adjust clothing after toileting  Function - Toilet Transfers Assist level to toilet: Touching or steadying assistance (Pt > 75%) Assist level from toilet: Touching or steadying assistance (Pt > 75%)  Function - Chair/bed transfer Chair/bed transfer method: Ambulatory Chair/bed transfer assist level: Touching or steadying assistance (Pt > 75%) Chair/bed transfer assistive device: Bedrails Chair/bed transfer details: Verbal cues for precautions/safety  Function - Locomotion: Wheelchair Will patient use wheelchair at discharge?: Yes Type: Manual Max wheelchair distance: 100 Assist Level: Touching or steadying assistance (Pt > 75%) Assist Level: Touching or steadying assistance (Pt > 75%) Wheel 150 feet activity did not occur: Safety/medical concerns Turns around,maneuvers to table,bed, and toilet,negotiates 3% grade,maneuvers on rugs and over doorsills: No Function - Locomotion: Ambulation Assistive device: Hand held assist Max distance: 80 Assist level: Touching or steadying assistance (Pt > 75%) Assist level: Touching or steadying assistance (Pt > 75%) Assist level: Touching or steadying assistance (Pt > 75%) Walk 150 feet activity did not occur: Safety/medical concerns Walk 10  feet on uneven surfaces activity did  not occur: Safety/medical concerns  Function - Comprehension Comprehension: Auditory Comprehension assist level: Understands complex 90% of the time/cues 10% of the time  Function - Expression Expression: Verbal Expression assist level: Expresses basic 90% of the time/requires cueing < 10% of the time.  Function - Social Interaction Social Interaction assist level: Interacts appropriately 90% of the time - Needs monitoring or encouragement for participation or interaction.  Function - Problem Solving Problem solving assist level: Solves basic 90% of the time/requires cueing < 10% of the time  Function - Memory Memory assist level: Recognizes or recalls 75 - 89% of the time/requires cueing 10 - 24% of the time Patient normally able to recall (first 3 days only): Staff names and faces, Current season, That he or she is in a hospital  Medical Problem List and Plan: 1.  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  DVT Prophylaxis/Anticoagulation: SCDs. Monitor for any signs of DVT 3. Pain Management: Neurontin 300 mg daily at bedtime, flank pain check Kpad , start sportscreme, check UA, headache post CVA added tramadol, no neuro changes consider topamax if persistent 4. Mood. Sinequan 10 mg daily at bedtime, Cymbalta 60 mg daily. 5. Neuropsych: This patient is capable of making decisions on his own behalf. 6. Skin/Wound Care: Routine skin checks 7. Fluids/Electrolytes/Nutrition: recheck BMET today 8. Hypertension. Hydrochlorothiazide 12.5 mg daily. Monitor for hypotension and orthostasis  Vitals:   04/26/16 1412 04/27/16 0454  BP: 127/85 97/67  Pulse:  69  Resp:  (!) 22  Temp:  98.5 F (36.9 C)    9. Hyperlipidemia. Lipitor 10. Tobacco abuse/marijuana. Nicoderm patch. Provide counseling   LOS (Days) 2 A FACE TO FACE EVALUATION WAS PERFORMED  Sharon Roberson  E 04/27/2016, 6:22 AM

## 2016-04-28 ENCOUNTER — Inpatient Hospital Stay (HOSPITAL_COMMUNITY): Payer: Medicare Other | Admitting: Occupational Therapy

## 2016-04-28 ENCOUNTER — Inpatient Hospital Stay (HOSPITAL_COMMUNITY): Payer: Medicare Other | Admitting: Speech Pathology

## 2016-04-28 ENCOUNTER — Inpatient Hospital Stay (HOSPITAL_COMMUNITY): Payer: Medicare Other | Admitting: Physical Therapy

## 2016-04-28 DIAGNOSIS — G8194 Hemiplegia, unspecified affecting left nondominant side: Secondary | ICD-10-CM

## 2016-04-28 LAB — URINE CULTURE

## 2016-04-28 LAB — COMPREHENSIVE METABOLIC PANEL
ALT: 17 U/L (ref 14–54)
ANION GAP: 8 (ref 5–15)
AST: 24 U/L (ref 15–41)
Albumin: 4 g/dL (ref 3.5–5.0)
Alkaline Phosphatase: 88 U/L (ref 38–126)
BILIRUBIN TOTAL: 0.6 mg/dL (ref 0.3–1.2)
BUN: 10 mg/dL (ref 6–20)
CHLORIDE: 103 mmol/L (ref 101–111)
CO2: 27 mmol/L (ref 22–32)
Calcium: 9.6 mg/dL (ref 8.9–10.3)
Creatinine, Ser: 0.73 mg/dL (ref 0.44–1.00)
Glucose, Bld: 103 mg/dL — ABNORMAL HIGH (ref 65–99)
Potassium: 3.6 mmol/L (ref 3.5–5.1)
Sodium: 138 mmol/L (ref 135–145)
TOTAL PROTEIN: 8.3 g/dL — AB (ref 6.5–8.1)

## 2016-04-28 LAB — CBC WITH DIFFERENTIAL/PLATELET
Basophils Absolute: 0 10*3/uL (ref 0.0–0.1)
Basophils Relative: 0 %
EOS PCT: 3 %
Eosinophils Absolute: 0.2 10*3/uL (ref 0.0–0.7)
HEMATOCRIT: 43.1 % (ref 36.0–46.0)
Hemoglobin: 14.7 g/dL (ref 12.0–15.0)
LYMPHS PCT: 38 %
Lymphs Abs: 2.3 10*3/uL (ref 0.7–4.0)
MCH: 31.1 pg (ref 26.0–34.0)
MCHC: 34.1 g/dL (ref 30.0–36.0)
MCV: 91.3 fL (ref 78.0–100.0)
MONO ABS: 0.6 10*3/uL (ref 0.1–1.0)
MONOS PCT: 10 %
NEUTROS ABS: 2.9 10*3/uL (ref 1.7–7.7)
Neutrophils Relative %: 49 %
PLATELETS: 232 10*3/uL (ref 150–400)
RBC: 4.72 MIL/uL (ref 3.87–5.11)
RDW: 12.9 % (ref 11.5–15.5)
WBC: 6 10*3/uL (ref 4.0–10.5)

## 2016-04-28 NOTE — Progress Notes (Signed)
Patient information reviewed and entered into eRehab system by Sanela Evola, RN, CRRN, PPS Coordinator.  Information including medical coding and functional independence measure will be reviewed and updated through discharge.     Per nursing patient was given "Data Collection Information Summary for Patients in Inpatient Rehabilitation Facilities with attached "Privacy Act Statement-Health Care Records" upon admission.  

## 2016-04-28 NOTE — Progress Notes (Signed)
Occupational Therapy Session Note  Patient Details  Name: Sharon Roberson MRN: [id number] Date of Birth: 11-02-1965  Today's Date: 04/28/2016 OT Individual Time: 1438-8875 OT Individual Time Calculation (min): 48 min     Short Term Goals: Week 1:  OT Short Term Goal 1 (Week 1): STGs=LTGs  Skilled Therapeutic Interventions/Progress Updates:    Nine hole peg test completed with results of:  L = 27 seconds R = 40 seconds   Pt reports high level of fatigue from day of therapy but agreeable to treatment.   Treatment session with focus on fine motor skills, functional transfers, and collaborative care planning. Pt completed x2 activities to address fine motor skill deficits which have impacted her RUE > LUE per pt report. Pt strung progressively smaller and more complex beads on elastic string bilaterally to note functional differences bilaterally. Problem solved in hand manipulation skills for improved access to bead entry holes. Pt also completed puzzle activity with pt flipping over puzzle pieces then placing with R hand to challenge targeting and in-hand fine motor skills.   Continue to challenge RUE in functional tasks such as IADLs noted by patient (light housekeeping, dishwashing, laundry, etc.).   Pt left in room with all needs in reach.   Therapy Documentation Precautions:  Precautions Precautions: Fall Restrictions Weight Bearing Restrictions: No General:   Vital Signs:  Pain:   Pt c/o unrated pain in R side. Declined to request pain medication at this time.     See Function Navigator for Current Functional Status.   Therapy/Group: Individual Therapy  Leafy Kindle 04/28/2016, 2:09 PM

## 2016-04-28 NOTE — Progress Notes (Signed)
Subjective/Complaints: no headache.   Back pain resolved Feels good today  PMH + kidney stone  ROS- no N/V/D  Objective: Vital Signs: Blood pressure (!) 146/97, pulse 97, temperature 98.3 F (36.8 C), temperature source Oral, resp. rate 20, height '4\' 10"'  (1.473 m), weight 75.8 kg (167 lb 1.7 oz), SpO2 100 %. No results found. Results for orders placed or performed during the hospital encounter of 04/25/16 (from the past 72 hour(s))  CBC     Status: None   Collection Time: 04/27/16  6:43 AM  Result Value Ref Range   WBC 7.3 4.0 - 10.5 K/uL   RBC 4.67 3.87 - 5.11 MIL/uL   Hemoglobin 14.8 12.0 - 15.0 g/dL   HCT 43.1 36.0 - 46.0 %   MCV 92.3 78.0 - 100.0 fL   MCH 31.7 26.0 - 34.0 pg   MCHC 34.3 30.0 - 36.0 g/dL   RDW 13.1 11.5 - 15.5 %   Platelets 201 150 - 400 K/uL  Basic metabolic panel     Status: None   Collection Time: 04/27/16  6:43 AM  Result Value Ref Range   Sodium 139 135 - 145 mmol/L   Potassium 3.7 3.5 - 5.1 mmol/L   Chloride 103 101 - 111 mmol/L   CO2 24 22 - 32 mmol/L   Glucose, Bld 96 65 - 99 mg/dL   BUN 13 6 - 20 mg/dL   Creatinine, Ser 0.91 0.44 - 1.00 mg/dL   Calcium 10.0 8.9 - 10.3 mg/dL   GFR calc non Af Amer >60 >60 mL/min   GFR calc Af Amer >60 >60 mL/min    Comment: (NOTE) The eGFR has been calculated using the CKD EPI equation. This calculation has not been validated in all clinical situations. eGFR's persistently <60 mL/min signify possible Chronic Kidney Disease.    Anion gap 12 5 - 15  Urinalysis, Routine w reflex microscopic (not at [facility])     Status: Abnormal   Collection Time: 04/27/16  1:54 PM  Result Value Ref Range   Color, Urine YELLOW YELLOW   APPearance CLEAR CLEAR   Specific Gravity, Urine 1.024 1.005 - 1.030   pH 6.0 5.0 - 8.0   Glucose, UA NEGATIVE NEGATIVE mg/dL   Hgb urine dipstick NEGATIVE NEGATIVE   Bilirubin Urine SMALL (A) NEGATIVE   Ketones, ur NEGATIVE NEGATIVE mg/dL   Protein, ur NEGATIVE NEGATIVE mg/dL   Nitrite NEGATIVE NEGATIVE   Leukocytes, UA NEGATIVE NEGATIVE    Comment: MICROSCOPIC NOT DONE ON URINES WITH NEGATIVE PROTEIN, BLOOD, LEUKOCYTES, NITRITE, OR GLUCOSE <1000 mg/dL.  CBC WITH DIFFERENTIAL     Status: None   Collection Time: 04/28/16  7:06 AM  Result Value Ref Range   WBC 6.0 4.0 - 10.5 K/uL   RBC 4.72 3.87 - 5.11 MIL/uL   Hemoglobin 14.7 12.0 - 15.0 g/dL   HCT 43.1 36.0 - 46.0 %   MCV 91.3 78.0 - 100.0 fL   MCH 31.1 26.0 - 34.0 pg   MCHC 34.1 30.0 - 36.0 g/dL   RDW 12.9 11.5 - 15.5 %   Platelets 232 150 - 400 K/uL   Neutrophils Relative % 49 %   Neutro Abs 2.9 1.7 - 7.7 K/uL   Lymphocytes Relative 38 %   Lymphs Abs 2.3 0.7 - 4.0 K/uL   Monocytes Relative 10 %   Monocytes Absolute 0.6 0.1 - 1.0 K/uL   Eosinophils Relative 3 %   Eosinophils Absolute 0.2 0.0 - 0.7 K/uL   Basophils  Relative 0 %   Basophils Absolute 0.0 0.0 - 0.1 K/uL  Comprehensive metabolic panel     Status: Abnormal   Collection Time: 04/28/16  7:06 AM  Result Value Ref Range   Sodium 138 135 - 145 mmol/L   Potassium 3.6 3.5 - 5.1 mmol/L   Chloride 103 101 - 111 mmol/L   CO2 27 22 - 32 mmol/L   Glucose, Bld 103 (H) 65 - 99 mg/dL   BUN 10 6 - 20 mg/dL   Creatinine, Ser 0.73 0.44 - 1.00 mg/dL   Calcium 9.6 8.9 - 10.3 mg/dL   Total Protein 8.3 (H) 6.5 - 8.1 g/dL   Albumin 4.0 3.5 - 5.0 g/dL   AST 24 15 - 41 U/L   ALT 17 14 - 54 U/L   Alkaline Phosphatase 88 38 - 126 U/L   Total Bilirubin 0.6 0.3 - 1.2 mg/dL   GFR calc non Af Amer >60 >60 mL/min   GFR calc Af Amer >60 >60 mL/min    Comment: (NOTE) The eGFR has been calculated using the CKD EPI equation. This calculation has not been validated in all clinical situations. eGFR's persistently <60 mL/min signify possible Chronic Kidney Disease.    Anion gap 8 5 - 15     HEENT: normal, no nystagmus Cardio: RRR and no murmur Resp: CTA B/L and unlabored GI: BS positive and NT/ND Extremity:  Pulses positive and No Edema Skin:   Intact Neuro:  Alert/Oriented and Abnormal Motor Right HP, 3- in RUE and RLE except trace R ADF,  Left side 4/5 in UE and LE, no evidence of dysmetria on left, right limited by strength Musc/Skel:  No pain with UE or LE ROM Gen NAD   Assessment/Plan: 1. 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 Physiatrist is providing close team supervision and 24 hour management of active medical problems listed below. Physiatrist and rehab team continue to assess barriers to discharge/monitor patient progress toward functional and medical goals. FIM: Function - Bathing Position: Wheelchair/chair at sink Body parts bathed by patient: Right arm, Left arm, Abdomen, Chest, Front perineal area, Buttocks, Right upper leg, Left upper leg, Right lower leg, Left lower leg Body parts bathed by helper: Back Assist Level: Touching or steadying assistance(Pt > 75%)  Function- Upper Body Dressing/Undressing What is the patient wearing?: Bra, Pull over shirt/dress Bra - Perfomed by patient: Thread/unthread right bra strap, Thread/unthread left bra strap Bra - Perfomed by helper: Hook/unhook bra (pull down sports bra) Pull over shirt/dress - Perfomed by patient: Thread/unthread right sleeve, Thread/unthread left sleeve, Put head through opening, Pull shirt over trunk Assist Level: Set up, Supervision or verbal cues Set up : To obtain clothing/put away Function - Lower Body Dressing/Undressing What is the patient wearing?: Pants, Underwear Position: Wheelchair/chair at sink Underwear - Performed by patient: Thread/unthread right underwear leg, Thread/unthread left underwear leg, Pull underwear up/down Pants- Performed by patient: Thread/unthread right pants leg, Thread/unthread left pants leg Pants- Performed by helper: Pull pants up/down Assist for footwear:  Partial/moderate assist  Function - Toileting Toileting steps completed by patient: Adjust clothing prior to toileting, Performs perineal hygiene, Adjust clothing after toileting Toileting Assistive Devices: Grab bar or rail  Function - Toilet Transfers Assist level to toilet: Touching or steadying assistance (Pt > 75%) Assist level from toilet: Touching or steadying assistance (Pt > 75%)  Function -  Chair/bed transfer Chair/bed transfer method: Ambulatory Chair/bed transfer assist level: Touching or steadying assistance (Pt > 75%) Chair/bed transfer assistive device: Bedrails, Armrests Chair/bed transfer details: Visual cues/gestures for precautions/safety  Function - Locomotion: Wheelchair Will patient use wheelchair at discharge?: Yes Type: Manual Max wheelchair distance: 100 Assist Level: Touching or steadying assistance (Pt > 75%) Assist Level: Touching or steadying assistance (Pt > 75%) Wheel 150 feet activity did not occur: Safety/medical concerns Turns around,maneuvers to table,bed, and toilet,negotiates 3% grade,maneuvers on rugs and over doorsills: No Function - Locomotion: Ambulation Assistive device: No device Max distance: 160 Assist level: Touching or steadying assistance (Pt > 75%) Assist level: Touching or steadying assistance (Pt > 75%) Assist level: Touching or steadying assistance (Pt > 75%) Walk 150 feet activity did not occur: Safety/medical concerns Assist level: Touching or steadying assistance (Pt > 75%) Walk 10 feet on uneven surfaces activity did not occur: Safety/medical concerns  Function - Comprehension Comprehension: Auditory Comprehension assist level: Understands complex 90% of the time/cues 10% of the time  Function - Expression Expression: Verbal Expression assist level: Expresses basic 90% of the time/requires cueing < 10% of the time.  Function - Social Interaction Social Interaction assist level: Interacts appropriately 90% of the time -  Needs monitoring or encouragement for participation or interaction.  Function - Problem Solving Problem solving assist level: Solves basic 90% of the time/requires cueing < 10% of the time  Function - Memory Memory assist level: Recognizes or recalls 75 - 89% of the time/requires cueing 10 - 24% of the time Patient normally able to recall (first 3 days only): Current season, Location of own room, Staff names and faces, That he or she is in a hospital  Medical Problem List and Plan: 1.  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  DVT Prophylaxis/Anticoagulation: SCDs. Monitor for any signs of DVT 3. Pain Management: Neurontin 300 mg daily at bedtime, flank pain check Kpad , start sportscreme, check UA, headache post CVA added tramadol, no neuro changes consider topamax if persistent, feels better today 4. Mood. Sinequan 10 mg daily at bedtime, Cymbalta 60 mg daily. 5. Neuropsych: This patient is capable of making decisions on his own behalf. 6. Skin/Wound Care: Routine skin checks 7. Fluids/Electrolytes/Nutrition: labs reviewed , normal 8. Hypertension. Hydrochlorothiazide 12.5 mg daily. BPs fluctuating will cont to monitor , no med changes Vitals:   04/28/16 0548 04/28/16 0742  BP: 115/68 (!) 146/97  Pulse: 94 97  Resp: 18 20  Temp: 98.4 F (36.9 C) 98.3 F (36.8 C)    9. Hyperlipidemia. Lipitor 10. Tobacco abuse/marijuana. Nicoderm patch. Provide counseling   LOS (Days) 3 A FACE TO FACE EVALUATION WAS PERFORMED  Sharon Roberson E 04/28/2016, 8:44 AM

## 2016-04-28 NOTE — IPOC Note (Signed)
Overall Plan of Care [facility]) Patient Details Name: Cabrielle Bilicki MRN: [id number] DOB: [birth year]  Admitting Diagnosis: stroke  Hospital Problems: Active Problems:   Left pontine CVA [facility])     Functional Problem List: Nursing Endurance, Edema, Medication Management, Motor, Nutrition, Pain, Safety, Sensory, Skin Integrity  PT Balance, Endurance, Pain, Safety  OT Balance, Cognition, Endurance, Motor, Safety, Vision  SLP Cognition  TR         Basic ADL's: OT Grooming, Bathing, Dressing, Toileting     Advanced  ADL's: OT Simple Meal Preparation, Laundry, Light Housekeeping     Transfers: PT Bed to Chair, Car  OT Tub/Shower     Locomotion: PT Ambulation, Stairs     Additional Impairments: OT Fuctional Use of Upper Extremity  SLP Communication, Social Cognition expression Memory, Problem Solving  TR      Anticipated Outcomes Item Anticipated Outcome  Self Feeding Mod I-Independent  Swallowing      Basic self-care  Supervision-Independent  Toileting  Mod I    Bathroom Transfers Supervision   Bowel/Bladder  Pt will remain continent of bowel and bladder with min assist   Transfers  Mod I to I for all mobility  Locomotion  Mod I to I for all mobility  Communication  Mod I  Cognition  Supervision   Pain  Pt will manage pain at 3 or less on a scale of 0-10.   Safety/Judgment  Pt will remain free of falls and injury  while on rehab with min assist    Therapy Plan: PT Intensity: Minimum of 1-2 x/day ,45 to 90 minutes PT Frequency: 5 out of 7 days PT Duration Estimated Length of Stay: 5-7 days OT Intensity: Minimum of 1-2 x/day, 45 to 90 minutes OT Frequency: 5 out of 7 days OT Duration/Estimated Length of Stay: 5-7 days SLP Intensity: Minumum of 1-2 x/day, 30 to 90 minutes SLP Frequency: 3 to 5 out of 7 days SLP Duration/Estimated Length of Stay: 7 days for SLP       Team Interventions: Nursing Interventions Patient/Family Education, Disease  Management/Prevention, Pain Management, Medication Management, Skin Care/Wound Management, Dysphagia/Aspiration Precaution Training, Cognitive Remediation/Compensation, Discharge Planning  PT interventions Ambulation/gait training, Patient/family education, UE/LE Coordination activities, UE/LE Strength taining/ROM, Neuromuscular re-education, Therapeutic Exercise, Therapeutic Activities, Functional mobility training, Discharge planning  OT Interventions Balance/vestibular training, Cognitive remediation/compensation, Community reintegration, Discharge planning, Disease mangement/prevention, Neuromuscular re-education, Functional mobility training, DME/adaptive equipment instruction, Patient/family education, Therapeutic Activities, Psychosocial support, Therapeutic Exercise, Self Care/advanced ADL retraining, UE/LE Strength taining/ROM, UE/LE Coordination activities  SLP Interventions Cueing hierarchy, Cognitive remediation/compensation, Environmental controls, Internal/external aids, Functional tasks, Patient/family education, Therapeutic Activities, Speech/Language facilitation  TR Interventions    SW/CM Interventions Discharge Planning, Psychosocial Support, Patient/Family Education    Team Discharge Planning: Destination: PT-Home ,OT- Home , SLP-Home Projected Follow-up: PT-Home health PT, OT-  Home health OT, Outpatient OT, SLP-Outpatient SLP (TBD) Projected Equipment Needs: PT-To be determined, OT- To be determined, SLP-None recommended by SLP Equipment Details: PT- , OT-  Patient/family involved in discharge planning: PT- Patient, Family member/caregiver,  OT-Patient, Family member/caregiver, SLP-Patient  MD ELOS: 7-10d Medical Rehab Prognosis:  Excellent Assessment: 50 y.o. right handed female with history of hypertension, hyperlipidemia, tobacco abuse. Per chart review patient lives with significant other versus friend. One level home. Independent prior to admission. Presented 04/23/2016  with slurred speech and right-sided weakness. Urine drug screen positive marijuana. Cranial CT scan negative. 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 No evidence of major occlusion  or significant proximal stenosis per MRA. Patient did not receive TPA. Echocardiogram with ejection fraction of 65% grade 1 diastolic dysfunction. Carotid Dopplers with no ICA stenosis. Cardiology services consulted placed on aspirin for CVA prophylaxis    Now requiring 24/7 Rehab RN,MD, as well as CIR level PT, OT and SLP.  Treatment team will focus on ADLs and mobility with goals set at [facility]  See Team Conference Notes for weekly updates to the plan of care

## 2016-04-28 NOTE — Progress Notes (Signed)
Occupational Therapy Session Note  Patient Details  Name: Sharon Roberson MRN: [id number] Date of Birth: Feb 20, 1966  Today's Date: 04/28/2016 OT Individual Time: 1000-1100 OT Individual Time Calculation (min): 60 min    Skilled Therapeutic Interventions/Progress Updates:   Patient participated in skilled OT with focus as follows:  Don and doffed both shoes with mod A and encouragement to try using right hand.    Required reminders to stay within the walker and look up at least occasionally while ambulating to gym to continue right hand coordination, strength and dexterity activities.   Patient educated on theraputty exercises and digging items out of medium soft (yellow) theraputty.    She stated certain theraputty strengthening activities caused pain through right 2nd digit.  Patient was left sitting EOB and talking with her case manager at the end of the session.  Therapy Documentation Precautions:  Precautions Precautions: Fall Restrictions Weight Bearing Restrictions: No General:   Vital Signs:  Pain: Pain Assessment Pain Assessment: No/denies pain See Function Navigator for Current Functional Status.   Therapy/Group: Individual Therapy  Bud Face [facility] 04/28/2016, 12:03 PM

## 2016-04-28 NOTE — Progress Notes (Signed)
Physical Therapy Session Note  Patient Details  Name: Adi Tetrault MRN: [id number] Date of Birth: 12-20-1965  Today's Date: 04/28/2016 PT Individual Time: 0805-0900 PT Individual Time Calculation (min): 55 min    Short Term Goals: Week 1:  PT Short Term Goal 1 (Week 1): STG=LTG  Skilled Therapeutic Interventions/Progress Updates:   Pt received seated on EOB completing self feeding; denies pain and agreeable to treatment. Pt excited that she was able to get up and shower with nursing this morning without assist. Min cues for functional use of RUE while eating, and educated on importance of utilizing RUE during functional tasks to improve coordination and strength. Gait to gym x160' with RW and close S; mild improvement in speed over no AD, however pt appears fearful of movement and concerned about RLE knee buckling/recurvatum. Heel lift added to R shoe with improvement noted in knee control and gait speed. Attempted lateral step ups with LLE to 6" step, however pt able to perform one with heavy reliance on UEs pulling and pt fearful of returning to stance on RLE after step up. Attempted again on 3" step with increased success, and pt able to perform 2 sets 5 reps BLE with light UE support; requires cues for increasing speed especially on stronger L side d/t pt fearful and hesitant to initiate. Stand pivot transfer x2 with close S w/c <>mat table. Alternating toe taps to 1" step with min guard and occasional mild LOB when in single limb stance on R side, minA to recover. Returned to room totalA. Boyfriend provided close S for stand pivot transfer w/c>bed and educated regarding safety during transfers, and no ambulating in the room at this time; pt and boyfriend agreeable. Updated safety plan to indicate boyfriend checked off to perform transfers with pt.   Therapy Documentation Precautions:  Precautions Precautions: Fall Restrictions Weight Bearing Restrictions: No Pain: Pain Assessment Pain  Assessment: No/denies pain   See Function Navigator for Current Functional Status.   Therapy/Group: Individual Therapy  Vista Lawman 04/28/2016, 9:06 AM

## 2016-04-28 NOTE — Progress Notes (Signed)
SLP Cancellation Note  Patient Details Name: Sharon Roberson MRN: [id number] DOB: 1966-06-07   Cancelled treatment:        Patient missed 30 minutes of skilled SLP therapy due to reported pain and fatigue.  Patient requested that SLP come back tomorrow to see her.                                                                                              Fae Pippin, M.A., CCC-SLP [phone]   Sharon Roberson 04/28/2016, 3:55 PM

## 2016-04-28 NOTE — Care Management (Signed)
Inpatient Rehabilitation Center Individual Statement of Services  Patient Name:  Sharon Roberson  Date:  04/28/2016  Welcome to the Inpatient Rehabilitation Center.  Our goal is to provide you with an individualized program based on your diagnosis and situation, designed to meet your specific needs.  With this comprehensive rehabilitation program, you will be expected to participate in at least 3 hours of rehabilitation therapies Monday-Friday, with modified therapy programming on the weekends.  Your rehabilitation program will include the following services:  Physical Therapy (PT), Occupational Therapy (OT), Speech Therapy (ST), 24 hour per day rehabilitation nursing, Therapeutic Recreaction (TR), Neuropsychology, Case Management (Social Worker), Rehabilitation Medicine, Nutrition Services and Pharmacy Services  Weekly team conferences will be held on Wednesday to discuss your progress.  Your Social Worker will talk with you frequently to get your input and to update you on team discussions.  Team conferences with you and your family in attendance may also be held.  Expected length of stay: 7 days Overall anticipated outcome: mod/i level  Depending on your progress and recovery, your program may change. Your Social Worker will coordinate services and will keep you informed of any changes. Your Social Worker's name and contact numbers are listed  below.  The following services may also be recommended but are not provided by the Inpatient Rehabilitation Center:   Home Health Rehabiltiation Services  Outpatient Rehabilitation Service  Arrangements will be made to provide these services after discharge if needed.  Arrangements include referral to agencies that provide these services.  Your insurance has been verified to be:  [employer] & Medicaid Your primary doctor is: Rosanne Sack  Pertinent information will be shared with your doctor and your insurance company.  Social Worker:  Dossie Der, SW [phone] or [phone]  Information discussed with and copy given to patient by: Lucy Chris, 04/28/2016, 10:28 AM

## 2016-04-28 NOTE — Progress Notes (Signed)
Social Work Assessment and Plan Social Work Assessment and Plan  Patient Details  Name: Sharon Roberson MRN: [id number] Date of Birth: 06/04/1966  Today's Date: 04/28/2016  Problem List:  Patient Active Problem List   Diagnosis Date Noted  . Left pontine CVA (HCC) 04/25/2016  . Acute right-sided weakness 04/23/2016  . CVA (cerebral infarction) 04/23/2016  . Benign essential HTN 04/23/2016  . Neuropathy (HCC) 04/23/2016  . Dyslipidemia 04/23/2016  . Right sided weakness    Past Medical History:  Past Medical History:  Diagnosis Date  . Hypercholesteremia   . Hypertension   . Pneumothorax   . Sarcoidosis [facility])    Past Surgical History: No past surgical history on file. Social History:  reports that she has been smoking Cigarettes.  She has been smoking about 0.50 packs per day. She does not have any smokeless tobacco history on file. She reports that she drinks alcohol. She reports that she does not use drugs.  Family / Support Systems Marital Status: Divorced Patient Roles: Partner, Parent Spouse/Significant Other: Molly Maduro McLendon-[phone]-cell Children: Enrique Sack Tyson-daughter [phone] Other Supports: Friends Anticipated Caregiver: Robert Ability/Limitations of Caregiver: Disabled with COPD Caregiver Availability: 24/7 Family Dynamics: Close with only daughter who was here then went back to work today-long distance truck Hospital doctor. Her boyfriend-Robert is very supportive and involved and will assist her. He is limitated due to his COPD and this hot weather makes it worse.  Social History Preferred language: English Religion: Unknown Cultural Background: No issues Education: High School Read: Yes Write: Yes Employment Status: Disabled Fish farm manager Issues: No issues Guardian/Conservator: None-according to MD pt is capable of making her own decisions while here.   Abuse/Neglect Physical Abuse: Denies Verbal Abuse: Denies Sexual Abuse:  Denies Exploitation of patient/patient's resources: Denies Self-Neglect: Denies  Emotional Status Pt's affect, behavior adn adjustment status: Pt has always been independent and taken care of others. She wants to be able to take care of herself after this rehab stay. She is making good progress and may reach mod/i level before discharge. She is motivated to improve and trying to keep her attitude bright Recent Psychosocial Issues: other health issues were managed by her PCP Pyschiatric History: History of depression PCP follows her medication and she has been doing well. She was worried after having a stroke but is improving so fast she is optimistic she will do well here. Made aware neuro-psych services she will think about  Substance Abuse History: Tobacco and was positive for marijuana she plans to quit both if she can, aware of the resources she can pursue. She feels tobacco will be the hardest but would help her boyfriend if she quit  Patient / Family Perceptions, Expectations & Goals Pt/Family understanding of illness & functional limitations: Pt and Molly Maduro are able to explain her stroke and deficits. She has made daily progress which is keeping her going and is working hard in therapy she is physically tired at the end of the day. She does ask MD questions and feels like she is being heard. Premorbid pt/family roles/activities: Programme researcher, broadcasting/film/video, Mom, retiree, friend, church member, etc Anticipated changes in roles/activities/participation: resume Pt/family expectations/goals: Pt states: " I want to take care of myself and recover from this."  Molly Maduro states: " I will help her but she is stubborn and wants to do it on her own."  [employer]: None Premorbid Home Care/DME Agencies: None Transportation available at discharge: Molly Maduro and friends Resource referrals recommended: Neuropsychology, Support group (specify)  Discharge Planning Living Arrangements:  Spouse/significant other Support Systems: Spouse/significant other, Children, Other relatives, Friends/neighbors Type of Residence: Private residence Insurance Resources: [employer] (specify county), Media planner (specify) Production designer, theatre/television/film) Surveyor, quantity Resources: SSI, Other (Comment) (Robert receives disability) Surveyor, quantity Screen Referred: No Living Expenses: Psychologist, sport and exercise Management: Patient, Significant Other Does the patient have any problems obtaining your medications?: No Home Management: She does the home management Patient/Family Preliminary Plans: Return home with Molly Maduro who can be there but not offer much physical assist, but this will not be needed, since pt is doing so well and improving daily. Goals are mod/i level and pt is pleased about this. Will meet with once team conference Wed and work on discharge needs. Social Work Anticipated Follow Up Needs: HH/OP, Support Group  Clinical Impression Pleasant female who is working hard in therapy to recover from this stroke. She is making daily therapy which is very encouraging to her. She should reach mod/i level and be a short length of stay. Will work on discharge needs. Supportive daughter and boyfriend.  Lucy Chris 04/28/2016, 11:29 AM

## 2016-04-29 ENCOUNTER — Inpatient Hospital Stay (HOSPITAL_COMMUNITY): Payer: Medicare Other | Admitting: Physical Therapy

## 2016-04-29 ENCOUNTER — Inpatient Hospital Stay (HOSPITAL_COMMUNITY): Payer: Medicare Other | Admitting: Speech Pathology

## 2016-04-29 ENCOUNTER — Inpatient Hospital Stay (HOSPITAL_COMMUNITY): Payer: Medicare Other | Admitting: Occupational Therapy

## 2016-04-29 LAB — HEMOGLOBIN A1C
Hgb A1c MFr Bld: 5.6 % (ref 4.8–5.6)
Mean Plasma Glucose: 114 mg/dL

## 2016-04-29 MED ORDER — SIMVASTATIN 40 MG PO TABS
80.0000 mg | ORAL_TABLET | Freq: Every day | ORAL | Status: DC
  Administered 2016-04-29: 80 mg via ORAL
  Filled 2016-04-29: qty 2

## 2016-04-29 MED ORDER — FLUTICASONE PROPIONATE 50 MCG/ACT NA SUSP
1.0000 | Freq: Two times a day (BID) | NASAL | Status: DC
  Administered 2016-04-29 – 2016-04-30 (×3): 1 via NASAL
  Filled 2016-04-29: qty 16

## 2016-04-29 MED ORDER — METHOCARBAMOL 500 MG PO TABS
1000.0000 mg | ORAL_TABLET | Freq: Three times a day (TID) | ORAL | Status: DC | PRN
  Administered 2016-04-29: 1000 mg via ORAL
  Filled 2016-04-29 (×2): qty 2

## 2016-04-29 NOTE — Progress Notes (Signed)
Occupational Therapy Session Note  Patient Details  Name: Sharon Roberson MRN: [id number] Date of Birth: December 05, 1965  Today's Date: 04/29/2016 OT Individual Time: 1157-2620 OT Individual Time Calculation (min): 58 min     Short Term Goals: Week 1:  OT Short Term Goal 1 (Week 1): STGs=LTGs  Skilled Therapeutic Interventions/Progress Updates:    Treatment session with focus on dynamic balance during home management tasks.  Pt reports fatigue but willing to participate in treatment session.  Discussed goals and progress towards goals with pt asking about d/c date.  Engaged in laundry task, light housekeeping, and simulated simple meal prep to assess and discuss safety and energy conservation strategies during familiar home making tasks.  Pt completed all tasks at supervision level with improved mobility and coordination of RUE.  Completed car transfer without cues and no safety concerns.  Completed 9 hole peg test Lt: 27 seconds and Rt 30 seconds.  Provided pt with theraband and issued HEP for BUE strengthening and FMC exercises.  Therapy Documentation Precautions:  Precautions Precautions: Fall Restrictions Weight Bearing Restrictions: No General:   Vital Signs: Therapy Vitals Temp: 97.4 F (36.3 C) Temp Source: Oral Pulse Rate: 83 Resp: 18 BP: 127/72 Patient Position (if appropriate): Lying Oxygen Therapy SpO2: 100 % O2 Device: Not Delivered Pain: Pain Assessment Pain Assessment: 0-10 Pain Score: 5  Pain Type: Acute pain Pain Location: Flank Pain Orientation: Right Pain Descriptors / Indicators: Other (Comment) (intense) Pain Onset: Gradual Patients Stated Pain Goal: 0 Pain Intervention(s): Medication (See eMAR) Multiple Pain Sites: No  See Function Navigator for Current Functional Status.   Therapy/Group: Individual Therapy  Rosalio Loud 04/29/2016, 3:44 PM

## 2016-04-29 NOTE — Progress Notes (Signed)
Physical Therapy Note  Patient Details  Name: Sharon Roberson MRN: [id number] Date of Birth: 1966/08/26 Today's Date: 04/29/2016    Time: 1100-1155 55 minutes  1:1 No c/o pain. Pt agreeable to treatment to work on balance and fine motor skills.  Standing balance with supervision for playing Wii Dance game which patient enjoyed very much.  Pt able to perform dynamic balance during dancing with no LOB.  Pt stood to dance x 30 minutes. Pt then requests to work on fine motor skills. Pt worked on stringing beads with increased time for smaller beads.  Pt states she is pleased with progress in both PT and OT.   Derold Dorsch 04/29/2016, 11:40 AM

## 2016-04-29 NOTE — Progress Notes (Signed)
Physical Therapy Session Note  Patient Details  Name: Sharon Roberson MRN: [id number] Date of Birth: [birth year]  Today's Date: 04/29/2016 PT Individual Time: 0900-1000 PT Individual Time Calculation (min): 60 min    Short Term Goals: Week 1:  PT Short Term Goal 1 (Week 1): STG=LTG  Skilled Therapeutic Interventions/Progress Updates:   Pt received seated on EOB, denies pain and agreeable to treatment. Gait to gym with no AD and close S, no LOB however occasional RLE buckling, no assist to maintain balance. Gait training in hall including side stepping, braiding with UE support on rail, backward walking. In gym, pt performed "electric slide" to music; able to perform lateral side stepping, backwards stepping and turning in time with music with S overall; no LOB and no buckling noted in RLE. Stairs 1x12 with variable step-to and reciprocal pattern, BUE support and S. Nustep x10 min with BUE/BLE level 3 for strengthening and aerobic endurance; pt initially performing average 8 steps/min; required cueing to increase speed to >30 steps/min, and then pt able to maintain >48 steps/min without cues for increased speed. Gait to return to room, no AD and S. Remained seated on EOB at completion of session, all needs in reach.   Therapy Documentation Precautions:  Precautions Precautions: Fall Restrictions Weight Bearing Restrictions: No Pain: Pain Assessment Pain Assessment: No/denies pain Pain Score: 3  (after therapy) Pain Onset: With Activity Pain Intervention(s): Medication (See eMAR) (prior to therapy)   See Function Navigator for Current Functional Status.   Therapy/Group: Individual Therapy  Vista Lawman 04/29/2016, 11:01 AM

## 2016-04-29 NOTE — Progress Notes (Signed)
Social Work Patient ID: Sharon Roberson, female   DOB: 1966-09-26, 50 y.o.   MRN: [id number]   Met with team who feels pt has reached her goals of mod/i level and would be ready for discharge tomorrow. Have checked with MD and he is agreeable to plan. Pt is agreeable and ready to go home. Will have am OT session and plan discharge mid-morning.

## 2016-04-29 NOTE — Progress Notes (Signed)
Speech Language Pathology Daily Session Note  Patient Details  Name: Sharon Roberson MRN: [id number] Date of Birth: 03-14-1966  Today's Date: 04/29/2016 SLP Individual Time: 1300-1330 SLP Individual Time Calculation (min): 30 min   Short Term Goals: Week 1: SLP Short Term Goal 1 (Week 1): Patient will utilize speech intelligibility strategies at the conversation level with Mod I to achieve 100% intelligibility. SLP Short Term Goal 2 (Week 1): Patient will utilize external memory aids to recall new, daily information with supervision verbal cues.  SLP Short Term Goal 3 (Week 1): Patient will complete complex problem solving tasks with Mod I.   Skilled Therapeutic Interventions:   Skilled treatment session focused on addressing cognition goals. SLP facilitated session by providing set-up of a deductive reasoning task, increased wait time, and Supervision level verbal cues to recognize errors, patient then Mod I for correcting them.  Patient utilized look back as a strategy for working memory during session independently.  Patient independently able to route find back to room at end of session.  Of note, while speech intelligibility goals were not addressed patient was intelligible throughout session.  Will continue to monitor.  Continue with current plan of care.   Function:  Cognition Comprehension Comprehension assist level: Follows complex conversation/direction with extra time/assistive device  Expression   Expression assist level: Expresses complex ideas: With extra time/assistive device  Social Interaction Social Interaction assist level: Interacts appropriately 90% of the time - Needs monitoring or encouragement for participation or interaction.  Problem Solving Problem solving assist level: Solves complex 90% of the time/cues < 10% of the time  Memory Memory assist level: Recognizes or recalls 75 - 89% of the time/requires cueing 10 - 24% of the time    Pain Pain Assessment Pain  Assessment: 0-10 Pain Score: 5  Pain Type: Acute pain Pain Location: Flank Pain Orientation: Right Pain Descriptors / Indicators: Other (Comment) (intense) Pain Onset: On-going Patients Stated Pain Goal: 0 Pain Intervention(s): RN made aware Multiple Pain Sites: No  Therapy/Group: Individual Therapy  Charlane Ferretti., CCC-SLP [phone]  [name] 04/29/2016, 1:22 PM

## 2016-04-29 NOTE — Progress Notes (Signed)
Subjective/Complaints: Pt wants to discuss meds Took zocor not lipitor at home, 2m per day on chronic basis  Also took methocarbamol at home on a chronic basis 10032mTID prn Also on fluticasone at home BID   PMH + kidney stone  ROS- no N/V/D  Objective: Vital Signs: Blood pressure 122/87, pulse 93, temperature 98.7 F (37.1 C), temperature source Oral, resp. rate 18, height '4\' 10"'  (1.473 m), weight 75.8 kg (167 lb 1.7 oz), SpO2 100 %. No results found. Results for orders placed or performed during the hospital encounter of 04/25/16 (from the past 72 hour(s))  CBC     Status: None   Collection Time: 04/27/16  6:43 AM  Result Value Ref Range   WBC 7.3 4.0 - 10.5 K/uL   RBC 4.67 3.87 - 5.11 MIL/uL   Hemoglobin 14.8 12.0 - 15.0 g/dL   HCT 43.1 36.0 - 46.0 %   MCV 92.3 78.0 - 100.0 fL   MCH 31.7 26.0 - 34.0 pg   MCHC 34.3 30.0 - 36.0 g/dL   RDW 13.1 11.5 - 15.5 %   Platelets 201 150 - 400 K/uL  Basic metabolic panel     Status: None   Collection Time: 04/27/16  6:43 AM  Result Value Ref Range   Sodium 139 135 - 145 mmol/L   Potassium 3.7 3.5 - 5.1 mmol/L   Chloride 103 101 - 111 mmol/L   CO2 24 22 - 32 mmol/L   Glucose, Bld 96 65 - 99 mg/dL   BUN 13 6 - 20 mg/dL   Creatinine, Ser 0.91 0.44 - 1.00 mg/dL   Calcium 10.0 8.9 - 10.3 mg/dL   GFR calc non Af Amer >60 >60 mL/min   GFR calc Af Amer >60 >60 mL/min    Comment: (NOTE) The eGFR has been calculated using the CKD EPI equation. This calculation has not been validated in all clinical situations. eGFR's persistently <60 mL/min signify possible Chronic Kidney Disease.    Anion gap 12 5 - 15  [facility]inalysis, [facility]    Status: Abnormal   Collection Time: 04/27/16  1:54 PM  Result Value Ref Range   Color, Urine YELLOW YELLOW   APPearance CLEAR CLEAR   Specific Gravity, Urine 1.024 1.005 - 1.030   pH 6.0 5.0 - 8.0   Glucose, UA NEGATIVE NEGATIVE mg/dL   Hgb urine dipstick NEGATIVE  NEGATIVE   Bilirubin Urine SMALL (A) NEGATIVE   Ketones, ur NEGATIVE NEGATIVE mg/dL   Protein, ur NEGATIVE NEGATIVE mg/dL   Nitrite NEGATIVE NEGATIVE   Leukocytes, UA NEGATIVE NEGATIVE    Comment: MICROSCOPIC NOT DONE ON URINES WITH NEGATIVE PROTEIN, BLOOD, LEUKOCYTES, NITRITE, OR GLUCOSE <1000 mg/dL.  Culture, Urine     Status: Abnormal   Collection Time: 04/27/16  1:54 PM  Result Value Ref Range   Specimen Description URINE, RANDOM    Special Requests NONE    Culture MULTIPLE SPECIES PRESENT, SUGGEST RECOLLECTION (A)    Report Status 04/28/2016 FINAL   CBC WITH DIFFERENTIAL     Status: None   Collection Time: 04/28/16  7:06 AM  Result Value Ref Range   WBC 6.0 4.0 - 10.5 K/uL   RBC 4.72 3.87 - 5.11 MIL/uL   Hemoglobin 14.7 12.0 - 15.0 g/dL   HCT 43.1 36.0 - 46.0 %   MCV 91.3 78.0 - 100.0 fL   MCH 31.1 26.0 - 34.0 pg   MCHC 34.1 30.0 - 36.0 g/dL  RDW 12.9 11.5 - 15.5 %   Platelets 232 150 - 400 K/uL   Neutrophils Relative % 49 %   Neutro Abs 2.9 1.7 - 7.7 K/uL   Lymphocytes Relative 38 %   Lymphs Abs 2.3 0.7 - 4.0 K/uL   Monocytes Relative 10 %   Monocytes Absolute 0.6 0.1 - 1.0 K/uL   Eosinophils Relative 3 %   Eosinophils Absolute 0.2 0.0 - 0.7 K/uL   Basophils Relative 0 %   Basophils Absolute 0.0 0.0 - 0.1 K/uL  Comprehensive metabolic panel     Status: Abnormal   Collection Time: 04/28/16  7:06 AM  Result Value Ref Range   Sodium 138 135 - 145 mmol/L   Potassium 3.6 3.5 - 5.1 mmol/L   Chloride 103 101 - 111 mmol/L   CO2 27 22 - 32 mmol/L   Glucose, Bld 103 (H) 65 - 99 mg/dL   BUN 10 6 - 20 mg/dL   Creatinine, Ser 0.73 0.44 - 1.00 mg/dL   Calcium 9.6 8.9 - 10.3 mg/dL   Total Protein 8.3 (H) 6.5 - 8.1 g/dL   Albumin 4.0 3.5 - 5.0 g/dL   AST 24 15 - 41 U/L   ALT 17 14 - 54 U/L   Alkaline Phosphatase 88 38 - 126 U/L   Total Bilirubin 0.6 0.3 - 1.2 mg/dL   GFR calc non Af Amer >60 >60 mL/min   GFR calc Af Amer >60 >60 mL/min    Comment: (NOTE) The eGFR has  been calculated using the CKD EPI equation. This calculation has not been validated in all clinical situations. eGFR's persistently <60 mL/min signify possible Chronic Kidney Disease.    Anion gap 8 5 - 15     HEENT: normal, no nystagmus Cardio: RRR and no murmur Resp: CTA B/L and unlabored GI: BS positive and NT/ND Extremity:  Pulses positive and No Edema Skin:   Intact Neuro: Alert/Oriented and Abnormal Motor Right HP, 3- in RUE and RLE except trace R ADF,  Left side 4/5 in UE and LE, no evidence of dysmetria on left, right limited by strength Musc/Skel:  No pain with UE or LE ROM Gen NAD   Assessment/Plan: 1. 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 Physiatrist is providing close team supervision and 24 hour management of active medical problems listed below. Physiatrist and rehab team continue to assess barriers to discharge/monitor patient progress toward functional and medical goals. FIM: Function - Bathing Position: Wheelchair/chair at sink Body parts bathed by patient: Right arm, Left arm, Abdomen, Chest, Front perineal area, Buttocks, Right upper leg, Left upper leg, Right lower leg, Left lower leg Body parts bathed by helper: Back Assist Level: Touching or steadying assistance(Pt > 75%)  Function- Upper Body Dressing/Undressing What is the patient wearing?: Bra, Pull over shirt/dress Bra - Perfomed by patient: Thread/unthread right bra strap, Thread/unthread left bra strap Bra - Perfomed by helper: Hook/unhook bra (pull down sports bra) Pull over shirt/dress - Perfomed by patient: Thread/unthread right sleeve, Thread/unthread left sleeve, Put head through opening, Pull shirt over trunk Assist Level: Set up, Supervision or verbal cues Set up : To obtain clothing/put away Function - Lower Body  Dressing/Undressing What is the patient wearing?: Pants, Underwear Position: Wheelchair/chair at sink Underwear - Performed by patient: Thread/unthread right underwear leg, Thread/unthread left underwear leg, Pull underwear up/down Pants- Performed by patient:  Thread/unthread right pants leg, Thread/unthread left pants leg Pants- Performed by helper: Pull pants up/down Assist for footwear: Partial/moderate assist  Function - Toileting Toileting steps completed by patient: Adjust clothing prior to toileting, Performs perineal hygiene, Adjust clothing after toileting Toileting Assistive Devices: Grab bar or rail Assist level: Supervision or verbal cues  Function Midwife transfer assistive device: Grab bar Assist level to toilet: Supervision or verbal cues Assist level from toilet: Supervision or verbal cues  Function - Chair/bed transfer Chair/bed transfer method: Ambulatory Chair/bed transfer assist level: Supervision or verbal cues Chair/bed transfer assistive device: Armrests, Walker Chair/bed transfer details: Verbal cues for precautions/safety, Verbal cues for safe use of DME/AE  Function - Locomotion: Wheelchair Will patient use wheelchair at discharge?: Yes Type: Manual Max wheelchair distance: 100 Assist Level: Touching or steadying assistance (Pt > 75%) Assist Level: Touching or steadying assistance (Pt > 75%) Wheel 150 feet activity did not occur: Safety/medical concerns Turns around,maneuvers to table,bed, and toilet,negotiates 3% grade,maneuvers on rugs and over doorsills: No Function - Locomotion: Ambulation Assistive device: Walker-rolling Max distance: 160 Assist level: Supervision or verbal cues Assist level: Supervision or verbal cues Assist level: Supervision or verbal cues Walk 150 feet activity did not occur: Safety/medical concerns Assist level: Supervision or verbal cues Walk 10 feet on uneven surfaces activity did not occur:  Safety/medical concerns  Function - Comprehension Comprehension: Auditory Comprehension assist level: Understands complex 90% of the time/cues 10% of the time  Function - Expression Expression: Verbal Expression assist level: Expresses basic 90% of the time/requires cueing < 10% of the time.  Function - Social Interaction Social Interaction assist level: Interacts appropriately 90% of the time - Needs monitoring or encouragement for participation or interaction.  Function - Problem Solving Problem solving assist level: Solves basic 90% of the time/requires cueing < 10% of the time  Function - Memory Memory assist level: Recognizes or recalls 75 - 89% of the time/requires cueing 10 - 24% of the time Patient normally able to recall (first 3 days only): Location of own room, That he or she is in a hospital  Medical Problem List and Plan: 1.  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  DVT Prophylaxis/Anticoagulation: SCDs. Monitor for any signs of DVT 3. Pain Management: Neurontin 300 mg daily at bedtime, flank pain check Kpad , start sportscreme, restart methocarbamol 4. Mood. Sinequan 10 mg daily at bedtime, Cymbalta 60 mg daily. 5. Neuropsych: This patient is capable of making decisions on his own behalf. 6. Skin/Wound Care: Routine skin checks 7. Fluids/Electrolytes/Nutrition: labs reviewed , normal 8. Hypertension. Hydrochlorothiazide 12.5 mg daily. BPs fluctuating will cont to monitor , no med changes Vitals:   04/28/16 1458 04/29/16 0527  BP: (!) 147/99 122/87  Pulse: 98 93  Resp: 18 18  Temp: 97.7 F (36.5 C) 98.7 F (37.1 C)    9. Hyperlipidemia. Lipitor switch to home med zocor 10. Tobacco abuse/marijuana. Nicoderm patch. Provide counseling   LOS (Days) 4 A FACE TO FACE EVALUATION WAS PERFORMED  KIRSTEINS,ANDREW E 04/29/2016, 8:32 AM

## 2016-04-29 NOTE — Patient Care Conference (Signed)
Inpatient RehabilitationTeam Conference and Plan of Care Update Date: 04/30/2016   Time: 11:27 AM    Patient Name: Sharon Roberson      Medical Record Number: [id number]  Date of Birth: May 14, 1966 Sex: Female         Room/Bed: 4W21C/4W21C-01 Payor Info: Payor: [employer] MEDICARE / Plan: BCBS MEDICARE / Product Type: *No Product type* /    Admitting Diagnosis: stroke  Admit Date/Time:  04/25/2016  6:39 PM Admission Comments: No comment available   Primary Diagnosis:  <principal problem not specified> Principal Problem: <principal problem not specified>  Patient Active Problem List   Diagnosis Date Noted  . Left pontine CVA (HCC) 04/25/2016  . Acute right-sided weakness 04/23/2016  . CVA (cerebral infarction) 04/23/2016  . Benign essential HTN 04/23/2016  . Neuropathy (HCC) 04/23/2016  . Dyslipidemia 04/23/2016  . Right sided weakness     Expected Discharge Date: Expected Discharge Date: 04/30/16  Team Members Present: Physician leading conference: Dr. Claudette Laws Social Worker Present: Dossie Der, LCSW Nurse Present: Carmie End, RN PT Present: Alyson Reedy, PT OT Present: Rosalio Loud, OT SLP Present: Fae Pippin, SLP PPS Coordinator present : Tora Duck, RN, CRRN     Current Status/Progress Goal Weekly Team Focus  Medical   Chronic low back pain, history of tobacco addiction, right hemiparesis  Home with adequate education to reduce recurrent stroke  Discharge planning   Bowel/Bladder     CONT b & b        Swallow/Nutrition/ Hydration             ADL's   Mod I - Independent with BADLs, supervision with IADLs  Mod I - Independent with BADLs, supervision with IADLs  pt/family education and d/c planning   Mobility   modI  modI  pt/family education to prepare for d/c   Communication             Safety/Cognition/ Behavioral Observations    no issues        Pain     no issues        Skin                *See Care Plan and progress  notes for long and short-term goals.  Barriers to Discharge: None    Possible Resolutions to Barriers:  Discharge today with home health follow-up    Discharge Planning/Teaching Needs:    Home with her boyfriend who can be there with her but not provide care due to his own health issues.     Team Discussion:  Goals-mod/i-independent, recovered very quickly from her stroke and is doing well.  Revisions to Treatment Plan:  DC Today   Continued Need for Acute Rehabilitation Level of Care: The patient requires daily medical management by a physician with specialized training in physical medicine and rehabilitation for the following conditions: Daily direction of a multidisciplinary physical rehabilitation program to ensure safe treatment while eliciting the highest outcome that is of practical value to the patient.: Yes Daily medical management of patient stability for increased activity during participation in an intensive rehabilitation regime.: Yes Daily analysis of laboratory values and/or radiology reports with any subsequent need for medication adjustment of medical intervention for : Blood pressure problems;Neurological problems  Lucy Chris 04/30/2016, 11:27 AM

## 2016-04-30 ENCOUNTER — Inpatient Hospital Stay (HOSPITAL_COMMUNITY): Payer: Medicare Other | Admitting: Occupational Therapy

## 2016-04-30 ENCOUNTER — Inpatient Hospital Stay (HOSPITAL_COMMUNITY): Payer: Medicare Other | Admitting: Speech Pathology

## 2016-04-30 MED ORDER — TRAMADOL HCL 50 MG PO TABS: 50.0000 mg | ORAL_TABLET | Freq: Four times a day (QID) | ORAL | 0 refills | Status: DC | PRN

## 2016-04-30 MED ORDER — SIMVASTATIN 80 MG PO TABS: 80.0000 mg | ORAL_TABLET | Freq: Every day | ORAL | 0 refills | Status: AC

## 2016-04-30 MED ORDER — HYDROCHLOROTHIAZIDE 12.5 MG PO TABS: 12.5000 mg | ORAL_TABLET | Freq: Every morning | ORAL | 0 refills | Status: DC

## 2016-04-30 MED ORDER — MUSCLE RUB 10-15 % EX CREA: 1.0000 "application " | TOPICAL_CREAM | Freq: Two times a day (BID) | CUTANEOUS | 0 refills | Status: DC | PRN

## 2016-04-30 MED ORDER — NICOTINE 14 MG/24HR TD PT24: 14.0000 mg | MEDICATED_PATCH | Freq: Every day | TRANSDERMAL | 0 refills | Status: DC

## 2016-04-30 MED ORDER — METHOCARBAMOL 500 MG PO TABS: 1000.0000 mg | ORAL_TABLET | Freq: Three times a day (TID) | ORAL | 0 refills | Status: DC | PRN

## 2016-04-30 NOTE — Discharge Instructions (Signed)
Inpatient Rehab Discharge Instructions  Sharon Roberson Discharge date and time:  04/30/16   Activities/Precautions/ Functional Status: Activity: no lifting, driving, or strenuous exercise till cleared by MD Diet: cardiac diet Wound Care: none needed   Functional status:  ___ No restrictions     ___ Walk up steps independently ___ 24/7 supervision/assistance   ___ Walk up steps with assistance _X__ Intermittent supervision/assistance  ___ Bathe/dress independently ___ Walk with walker     ___ Bathe/dress with assistance ___ Walk Independently    ___ Shower independently ___ Walk with assistance    ___ Shower with assistance _X__ No alcohol     ___ Return to work/school ________  Special Instructions: 1. No Marijuana --Correlation to cause strokes.    COMMUNITY REFERRALS UPON DISCHARGE:    Home Health:   PT,OT,SP   Agency:[facility] Phone:[phone]   Date of last service:04/30/2016  Medical Equipment/Items Ordered:TUB SEAT  Agency/Supplier:[facility]  [phone]   GENERAL COMMUNITY RESOURCES FOR PATIENT/FAMILY: Support Groups:[facility] SUPPORT GROUP EVERY SECOND Thursday @ 3;00-4:00 PM ON THE REHAB UNIT QUESTIONS CONTACT KATIE [phone]   STROKE/TIA DISCHARGE INSTRUCTIONS SMOKING Cigarette smoking nearly doubles your risk of having a stroke & is the single most alterable risk factor  If you smoke or have smoked in the last 12 months, you are advised to quit smoking for your health.  Most of the excess cardiovascular risk related to smoking disappears within a year of stopping.  Ask you doctor about anti-smoking medications  [place] Quit Line: 1-800-QUIT NOW  Free Smoking Cessation Classes [phone]  CHOLESTEROL Know your levels; limit fat & cholesterol in your diet  Lipid Panel     Component Value Date/Time   CHOL 251 (H) 04/24/2016 0228   TRIG 250 (H) 04/24/2016 0228   HDL 54 04/24/2016 0228   CHOLHDL 4.6 04/24/2016 0228   VLDL 50 (H) 04/24/2016  0228   LDLCALC 147 (H) 04/24/2016 0228      Many patients benefit from treatment even if their cholesterol is at goal.  Goal: Total Cholesterol (CHOL) less than 160  Goal:  Triglycerides (TRIG) less than 150  Goal:  HDL greater than 40  Goal:  LDL (LDLCALC) less than 100   BLOOD PRESSURE American Stroke Association blood pressure target is less that 120/80 mm/Hg  Your discharge blood pressure is:  BP: (!) 142/94  Monitor your blood pressure  Limit your salt and alcohol intake  Many individuals will require more than one medication for high blood pressure  DIABETES (A1c is a blood sugar average for last 3 months) Goal HGBA1c is under 7% (HBGA1c is blood sugar average for last 3 months)  Diabetes: No known diagnosis of diabetes    Lab Results  Component Value Date   HGBA1C 5.6 04/24/2016     Your HGBA1c can be lowered with medications, healthy diet, and exercise.  Check your blood sugar as directed by your physician  Call your physician if you experience unexplained or low blood sugars.  PHYSICAL ACTIVITY/REHABILITATION Goal is 30 minutes at least 4 days per week  Activity: No driving, Therapies: See above Return to work: to be decided on follow up.  Activity decreases your risk of heart attack and stroke and makes your heart stronger.  It helps control your weight and blood pressure; helps you relax and can improve your mood.  Participate in a regular exercise program.  Talk with your doctor about the best form of exercise for you (dancing, walking, swimming, cycling).  DIET/WEIGHT Goal is to maintain a healthy weight  Your discharge diet is: Diet Heart Room service appropriate?: Yes; Fluid consistency:: Thin  liquids Your height is:  Height: 4\' 10"  (147.3 cm) Your current weight is: Weight: 75.8 kg (167 lb 1.7 oz) Your Body Mass Index (BMI) is:  BMI (Calculated): 35  Following the type of diet specifically designed for you will help prevent another stroke.  Your  goal weight is:  120 lbs  Your goal Body Mass Index (BMI) is 19-24.  Healthy food habits can help reduce 3 risk factors for stroke:  High cholesterol, hypertension, and excess weight.  RESOURCES Stroke/Support Group:  Call [phone]   STROKE EDUCATION PROVIDED/REVIEWED AND GIVEN TO PATIENT Stroke warning signs and symptoms How to activate emergency medical system (call 911). Medications prescribed at discharge. Need for follow-up after discharge. Personal risk factors for stroke. Pneumonia vaccine given:  Flu vaccine given:  My questions have been answered, the writing is legible, and I understand these instructions.  I will adhere to these goals & educational materials that have been provided to me after my discharge from the hospital.      My questions have been answered and I understand these instructions. I will adhere to these goals and the provided educational materials after my discharge from the hospital.  Patient/Caregiver Signature _______________________________ Date __________  Clinician Signature _______________________________________ Date __________  Please bring this form and your medication list with you to all your follow-up doctor's appointments.

## 2016-04-30 NOTE — Progress Notes (Signed)
Patient discharged to home accompanied by family

## 2016-04-30 NOTE — Progress Notes (Signed)
Occupational Therapy Session Note  Patient Details  Name: Angelicamarie Carnathan MRN: [id number] Date of Birth: Feb 24, 1966  Today's Date: 04/30/2016 OT Individual Time: 8563-1497 OT Individual Time Calculation (min): 42 min     Short Term Goals: Week 1:  OT Short Term Goal 1 (Week 1): STGs=LTGs  Skilled Therapeutic Interventions/Progress Updates:    Completed ADL retraining at overall Modified Independent - Independent level with carryover of education from previous sessions.  Pt utilized shower chair for safety and energy conservation during bathing.  Completed dressing at sit > stand level and all grooming tasks in standing.  Provided pt with written handout from therapy team regarding recommendations to increase safety.  Pt reports understanding and no further questions.  Therapy Documentation Precautions:  Precautions Precautions: Fall Restrictions Weight Bearing Restrictions: No General:   Vital Signs: Therapy Vitals Temp: 98 F (36.7 C) Temp Source: Oral Pulse Rate: 86 Resp: 16 BP: (!) 142/94 Patient Position (if appropriate): Lying Oxygen Therapy SpO2: 97 % O2 Device: Not Delivered Pain: Pt with no c/o pain  See Function Navigator for Current Functional Status.   Therapy/Group: Individual Therapy  Rosalio Loud 04/30/2016, 8:58 AM

## 2016-04-30 NOTE — Progress Notes (Signed)
Subjective/Complaints: Pt wants to discuss CVA type   ROS- no N/V/D.  -CP/SOB  Objective: Vital Signs: Blood pressure (!) 142/94, pulse 86, temperature 98 F (36.7 C), temperature source Oral, resp. rate 16, height '4\' 10"'  (1.473 m), weight 75.8 kg (167 lb 1.7 oz), SpO2 97 %. No results found. Results for orders placed or performed during the hospital encounter of 04/25/16 (from the past 72 hour(s))  Urinalysis, Routine w reflex microscopic (not at [facility])     Status: Abnormal   Collection Time: 04/27/16  1:54 PM  Result Value Ref Range   Color, Urine YELLOW YELLOW   APPearance CLEAR CLEAR   Specific Gravity, Urine 1.024 1.005 - 1.030   pH 6.0 5.0 - 8.0   Glucose, UA NEGATIVE NEGATIVE mg/dL   Hgb urine dipstick NEGATIVE NEGATIVE   Bilirubin Urine SMALL (A) NEGATIVE   Ketones, ur NEGATIVE NEGATIVE mg/dL   Protein, ur NEGATIVE NEGATIVE mg/dL   Nitrite NEGATIVE NEGATIVE   Leukocytes, UA NEGATIVE NEGATIVE    Comment: MICROSCOPIC NOT DONE ON URINES WITH NEGATIVE PROTEIN, BLOOD, LEUKOCYTES, NITRITE, OR GLUCOSE <1000 mg/dL.  Culture, Urine     Status: Abnormal   Collection Time: 04/27/16  1:54 PM  Result Value Ref Range   Specimen Description URINE, RANDOM    Special Requests NONE    Culture MULTIPLE SPECIES PRESENT, SUGGEST RECOLLECTION (A)    Report Status 04/28/2016 FINAL   CBC WITH DIFFERENTIAL     Status: None   Collection Time: 04/28/16  7:06 AM  Result Value Ref Range   WBC 6.0 4.0 - 10.5 K/uL   RBC 4.72 3.87 - 5.11 MIL/uL   Hemoglobin 14.7 12.0 - 15.0 g/dL   HCT 43.1 36.0 - 46.0 %   MCV 91.3 78.0 - 100.0 fL   MCH 31.1 26.0 - 34.0 pg   MCHC 34.1 30.0 - 36.0 g/dL   RDW 12.9 11.5 - 15.5 %   Platelets 232 150 - 400 K/uL   Neutrophils Relative % 49 %   Neutro Abs 2.9 1.7 - 7.7 K/uL   Lymphocytes Relative 38 %   Lymphs Abs 2.3 0.7 - 4.0 K/uL   Monocytes Relative 10 %   Monocytes Absolute 0.6 0.1 - 1.0 K/uL   Eosinophils Relative 3 %   Eosinophils Absolute 0.2 0.0 -  0.7 K/uL   Basophils Relative 0 %   Basophils Absolute 0.0 0.0 - 0.1 K/uL  Comprehensive metabolic panel     Status: Abnormal   Collection Time: 04/28/16  7:06 AM  Result Value Ref Range   Sodium 138 135 - 145 mmol/L   Potassium 3.6 3.5 - 5.1 mmol/L   Chloride 103 101 - 111 mmol/L   CO2 27 22 - 32 mmol/L   Glucose, Bld 103 (H) 65 - 99 mg/dL   BUN 10 6 - 20 mg/dL   Creatinine, Ser 0.73 0.44 - 1.00 mg/dL   Calcium 9.6 8.9 - 10.3 mg/dL   Total Protein 8.3 (H) 6.5 - 8.1 g/dL   Albumin 4.0 3.5 - 5.0 g/dL   AST 24 15 - 41 U/L   ALT 17 14 - 54 U/L   Alkaline Phosphatase 88 38 - 126 U/L   Total Bilirubin 0.6 0.3 - 1.2 mg/dL   GFR calc non Af Amer >60 >60 mL/min   GFR calc Af Amer >60 >60 mL/min    Comment: (NOTE) The eGFR has been calculated using the CKD EPI equation. This calculation has not been validated in all clinical  situations. eGFR's persistently <60 mL/min signify possible Chronic Kidney Disease.    Anion gap 8 5 - 15     HEENT: normal, no nystagmus Cardio: RRR and no murmur Resp: CTA B/L and unlabored GI: BS positive and NT/ND Extremity:  Pulses positive and No Edema Skin:   Intact Neuro: Alert/Oriented and Abnormal Motor Right HP, 3- in RUE and RLE except trace R ADF,  Left side 4/5 in UE and LE, no evidence of dysmetria on left, right limited by strength Musc/Skel:  No pain with UE or LE ROM Gen NAD   Assessment/Plan: 1. 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  Stable for D/C today F/u PCP in 3-4 weeks F/u PM&R 2 weeks See D/C summary See D/C instructionsFIM: Function - Bathing Position: Shower Body parts bathed by patient: Right arm, Left arm, Abdomen, Chest, Front perineal area, Buttocks, Right upper leg, Left upper leg, Right lower leg, Left lower leg, Back Body parts bathed by helper: Back Assist Level: Assistive device Assistive Device Comment: grab bars and shower seat  Function- Upper  Body Dressing/Undressing What is the patient wearing?: Bra, Pull over shirt/dress Bra - Perfomed by patient: Thread/unthread right bra strap, Thread/unthread left bra strap, Hook/unhook bra (pull down sports bra) Bra - Perfomed by helper: Hook/unhook bra (pull down sports bra) Pull over shirt/dress - Perfomed by patient: Thread/unthread right sleeve, Thread/unthread left sleeve, Put head through opening, Pull shirt over trunk Assist Level: No help, No cues Set up : To obtain clothing/put away Function - Lower Body Dressing/Undressing What is the patient wearing?: Underwear, Pants, Socks, Shoes Position: Sitting EOB Underwear - Performed by patient: Thread/unthread right underwear leg, Thread/unthread left underwear leg, Pull underwear up/down Pants- Performed by patient: Thread/unthread right pants leg, Thread/unthread left pants leg, Pull pants up/down Pants- Performed by helper: Pull pants up/down Socks - Performed by patient: Don/doff right sock, Don/doff left sock Shoes - Performed by patient: Don/doff right shoe, Don/doff left shoe, Fasten right, Fasten left Assist for footwear: Independent Assist for lower body dressing: No Help, No cues  Function - Toileting Toileting steps completed by patient: Adjust clothing prior to toileting, Performs perineal hygiene, Adjust clothing after toileting Toileting Assistive Devices: Grab bar or rail Assist level: No help/no cues (pt independent in room)  Function - Air cabin crew transfer assistive device: Grab bar Assist level to toilet: No Help, No cues (pt independent in room ) Assist level from toilet: No Help, No cues (pt independent in room)  Function - Chair/bed transfer Chair/bed transfer method: Ambulatory Chair/bed transfer assist level: Supervision or verbal cues Chair/bed transfer assistive device: Armrests Chair/bed transfer details: Verbal cues for precautions/safety  Function - Locomotion: Wheelchair Will patient use  wheelchair at discharge?: Yes Type: Manual Max wheelchair distance: 100 Assist Level: Touching or steadying assistance (Pt > 75%) Assist Level: Touching or steadying assistance (Pt > 75%) Wheel 150 feet activity did not occur: Safety/medical concerns Turns around,maneuvers to table,bed, and toilet,negotiates 3% grade,maneuvers on rugs and over doorsills: No Function - Locomotion: Ambulation Assistive device: No device Max distance: 160 Assist level: Supervision or verbal cues Assist level: Supervision or verbal cues Assist level: Supervision or verbal cues Walk 150 feet activity did not occur: Safety/medical concerns Assist level: Supervision or verbal cues Walk 10 feet on uneven surfaces activity did not occur: Safety/medical concerns  Function - Comprehension Comprehension: Auditory Comprehension assist level: Follows complex conversation/direction with extra time/assistive device  Function - Expression Expression: Verbal  Expression assist level: Expresses complex ideas: With extra time/assistive device  Function - Social Interaction Social Interaction assist level: Interacts appropriately with others with medication or extra time (anti-anxiety, antidepressant).  Function - Problem Solving Problem solving assist level: Solves complex 90% of the time/cues < 10% of the time  Function - Memory Memory assist level: Recognizes or recalls 90% of the time/requires cueing < 10% of the time Patient normally able to recall (first 3 days only): Current season, Location of own room, Staff names and faces, That he or she is in a hospital  Medical Problem List and Plan: 1.  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  DVT Prophylaxis/Anticoagulation: SCDs. Monitor for any signs of DVT 3. Pain Management: Neurontin 300 mg daily at bedtime, flank pain check Kpad , start sportscreme, restart methocarbamol 4. Mood. Sinequan 10 mg  daily at bedtime, Cymbalta 60 mg daily. 5. Neuropsych: This patient is capable of making decisions on his own behalf. 6. Skin/Wound Care: Routine skin checks 7. Fluids/Electrolytes/Nutrition: labs reviewed , normal 8. Hypertension. Hydrochlorothiazide 12.5 mg daily. BPs fluctuating will cont to monitor , no med changes Vitals:   04/29/16 1300 04/30/16 0557  BP: 127/72 (!) 142/94  Pulse: 83 86  Resp: 18 16  Temp: 97.4 F (36.3 C) 98 F (36.7 C)    9. Hyperlipidemia. Lipitor switch to home med zocor 10. Tobacco abuse/marijuana. Nicoderm patch. Provide counseling   LOS (Days) 5 A FACE TO FACE EVALUATION WAS PERFORMED  KIRSTEINS,ANDREW E 04/30/2016, 9:02 AM

## 2016-04-30 NOTE — Progress Notes (Signed)
Speech Language Pathology Discharge Summary  Patient Details  Name: Sharon Roberson MRN: [id number] Date of Birth: 05/27/1966  Today's Date: 04/30/2016 SLP Individual Time: 0730-0800 SLP Individual Time Calculation (min): 30 min    Skilled Therapeutic Interventions:  Skilled treatment session focused on completion of patient education. SLP facilitated session by providing a handout and verbal education in regards to memory compensatory strategies to utilize at home to maximize recall and carryover of new information. Patient asking appropriate questions at this time and all questions were answered to patient's satisfaction. Patient left in bed with all needs within reach. Continue with current plan of care.      Patient has met 3 of 3 long term goals.  Patient to discharge at overall Supervision;Modified Independent level.   Reasons goals not met: N/A   Clinical Impression/Discharge Summary: Patient has made functional gains and has met 3 of 3 LTG's this admission due to improved speech intelligibility and cognitive function. Currently, patient is 100% intelligible at the conversation level with Mod I. Patient is also overall supervision-Mod I for recall of new information with use of compensatory strategies and complex problem solving. Patient education is complete and patient will discharge home with family with intermittent supervision. Patient would benefit from f/u SLP services to maximize overall cognitive function and functional independence.   Care Partner:  Caregiver Able to Provide Assistance: Yes  Type of Caregiver Assistance: Physical;Cognitive  Recommendation:  Home Health SLP  Rationale for SLP Follow Up: Maximize cognitive function and independence   Equipment: N/A   Reasons for discharge: Discharged from hospital;Treatment goals met   Patient/Family Agrees with Progress Made and Goals Achieved: Yes   Function:  Cognition Comprehension Comprehension assist level:  Follows complex conversation/direction with extra time/assistive device  Expression   Expression assist level: Expresses complex ideas: With extra time/assistive device  Social Interaction Social Interaction assist level: Interacts appropriately with others with medication or extra time (anti-anxiety, antidepressant).  Problem Solving Problem solving assist level: Solves complex problems: With extra time  Memory Memory assist level: Recognizes or recalls 90% of the time/requires cueing < 10% of the time   PAYNE, COURTNEY 04/30/2016, 10:01 AM    

## 2016-04-30 NOTE — Progress Notes (Signed)
Occupational Therapy Discharge Summary  Patient Details  Name: Sharon Roberson MRN: [id number] Date of Birth: May 22, 1966  Patient has met 9 of 9 long term goals due to improved activity tolerance, improved balance, ability to compensate for deficits, functional use of  RIGHT upper and RIGHT lower extremity, improved awareness and improved coordination.  Patient to discharge at overall Modified Independent level, supervision with IADLs.  Patient's care partner is independent to provide the necessary cognitive assistance at discharge.  Patient lives with boyfriend, who has been present for many therapy sessions and verbalizes understanding.  Reasons goals not met: N/A  Recommendation:  Patient will benefit from ongoing skilled OT services in outpatient setting to continue to advance functional skills in the area of BADL, iADL and Reduce care partner burden.  Equipment: shower seat  Reasons for discharge: treatment goals met and discharge from hospital  Patient/family agrees with progress made and goals achieved: Yes  OT Discharge Precautions/Restrictions  Precautions Precautions: Fall General   Vital Signs Therapy Vitals Temp: 98 F (36.7 C) Temp Source: Oral Pulse Rate: 86 Resp: 16 BP: (!) 142/94 Patient Position (if appropriate): Lying Oxygen Therapy SpO2: 97 % O2 Device: Not Delivered Pain Pain Assessment Pain Assessment: No/denies pain ADL  See Function Navigator Vision/Perception  Vision- History Baseline Vision/History: Glaucoma Patient Visual Report: No change from baseline Vision- Assessment Vision Assessment?: No apparent visual deficits Perception Comments: WFL  Cognition Overall Cognitive Status: Impaired/Different from baseline Arousal/Alertness: Awake/alert Orientation Level: Oriented X4 Attention: Selective Selective Attention: Appears intact Memory: Impaired Memory Impairment: Decreased short term memory Awareness: Impaired Awareness Impairment:  Anticipatory impairment Safety/Judgment:  (during familiar, functional tasks) Sensation Sensation Light Touch: Appears Intact Stereognosis: Appears Intact Hot/Cold: Appears Intact Proprioception: Appears Intact Coordination Fine Motor Movements are Fluid and Coordinated: No (mild fine motor deficits on Rt) 9 Hole Peg Test: Lt: 27 seconds, Rt: 30 seconds Extremity/Trunk Assessment RUE Assessment RUE Assessment: Within Functional Limits LUE Assessment LUE Assessment: Within Functional Limits   See Function Navigator for Current Functional Status.  Simonne Come 04/30/2016, 9:05 AM

## 2016-04-30 NOTE — Progress Notes (Signed)
Social Work  Discharge Note  The overall goal for the admission was met for:   Discharge location: [place]  Length of Stay: Yes-5 DAYS  Discharge activity level: Yes-MOD/I LEVEL  Home/community participation: Yes  Services provided included: MD, RD, PT, OT, SLP, RN, CM, Pharmacy and SW  Financial Services: Medicaid and Private Insurance: [place]  Follow-up services arranged: Home Health: [facility], DME: [facility] and Patient/Family has no preference for HH/DME agencies  Comments (or additional information):PT DID VERY WELL AND REACHED HER GOALS SOONER THAN EXPECTED. READY FOR DISCHARGE.  Patient/Family verbalized understanding of follow-up arrangements: Yes  Individual responsible for coordination of the follow-up plan: SELF & ROBERT-BOYFRIEND  Confirmed correct DME delivered: Elease Hashimoto 04/30/2016    Elease Hashimoto

## 2016-05-01 ENCOUNTER — Telehealth: Payer: Self-pay | Admitting: Physical Medicine & Rehabilitation

## 2016-05-01 NOTE — Telephone Encounter (Signed)
Arlys John with [facility] pharmacy needs a call back about a drug interaction between Tramadol and Silenor.  His number is [phone].

## 2016-05-01 NOTE — Progress Notes (Signed)
Physical Therapy Discharge Summary  Patient Details  Name: Sharon Roberson MRN: [id number] Date of Birth: 08-14-1966  Patient has met 6 of 7 long term goals due to improved activity tolerance, improved balance, improved postural control, increased strength, ability to compensate for deficits, functional use of  right upper extremity and right lower extremity, improved attention, improved awareness and improved coordination.  Patient to discharge at an ambulatory level Modified Independent.   Patient's care partner is independent to provide the necessary physical assistance at discharge.  Reasons goals not met: Stair goal adequate for discharge; recommend S d/t pt hesitancy/fear.  Recommendation:  Patient will benefit from ongoing skilled PT services in outpatient setting to continue to advance safe functional mobility, address ongoing impairments in dynamic balance, coordination, RLE NMR, and minimize fall risk.  Equipment: No equipment provided  Reasons for discharge: treatment goals met and discharge from hospital  Patient/family agrees with progress made and goals achieved: Yes  PT Discharge Precautions/Restrictions Precautions Precautions: Fall Restrictions Weight Bearing Restrictions: No Pain  Denies pain Vision/Perception  Perception Comments: WFL  Cognition Overall Cognitive Status: Impaired/Different from baseline Arousal/Alertness: Awake/alert Orientation Level: Oriented X4 Focused Attention: Appears intact Sustained Attention: Appears intact Selective Attention: Appears intact Memory: Impaired Memory Impairment: Decreased short term memory Awareness: Impaired Awareness Impairment: Anticipatory impairment Problem Solving: Appears intact Safety/Judgment: Appears intact Sensation Sensation Light Touch: Appears Intact Stereognosis: Not tested Hot/Cold: Not tested Motor  Motor Motor: Within Functional Limits Motor - Discharge Observations: [facility]  Mobility Bed  Mobility Bed Mobility: Supine to Sit;Sit to Supine Supine to Sit: 7: Independent Sit to Supine: 7: Independent Transfers Transfers: Yes Sit to Stand: 6: Modified independent (Device/Increase time) Stand to Sit: 6: Modified independent (Device/Increase time) Stand Pivot Transfers: 6: Modified independent (Device/Increase time) Locomotion  Ambulation Ambulation: Yes Ambulation/Gait Assistance: 6: Modified independent (Device/Increase time) Ambulation Distance (Feet): 200 Feet Assistive device: None Gait Gait: Yes Gait Pattern: Right genu recurvatum;Shuffle;Decreased trunk rotation Gait velocity: decreased for age/gender norms Stairs / Additional Locomotion Stairs: Yes Stairs Assistance: 5: Supervision Stairs Assistance Details: Verbal cues for precautions/safety Stair Management Technique: Two rails;Alternating pattern;Forwards Number of Stairs: 8 Height of Stairs: 6 Wheelchair Mobility Wheelchair Mobility: No  Trunk/Postural Assessment  Cervical Assessment Cervical Assessment: Within Functional Limits Thoracic Assessment Thoracic Assessment: Within Functional Limits Lumbar Assessment Lumbar Assessment: Within Functional Limits Postural Control Postural Control: Within Functional Limits  Balance Balance Balance Assessed: Yes Static Sitting Balance Static Sitting - Balance Support: No upper extremity supported;Feet supported Static Sitting - Level of Assistance: 7: Independent Dynamic Sitting Balance Dynamic Sitting - Balance Support: No upper extremity supported;Feet supported;During functional activity Dynamic Sitting - Level of Assistance: 6: Modified independent (Device/Increase time) Dynamic Sitting - Balance Activities: Lateral lean/weight shifting;Forward lean/weight shifting;Reaching across midline Static Standing Balance Static Standing - Balance Support: No upper extremity supported Static Standing - Level of Assistance: 6: Modified independent (Device/Increase  time) Dynamic Standing Balance Dynamic Standing - Balance Support: No upper extremity supported;During functional activity Dynamic Standing - Level of Assistance: 6: Modified independent (Device/Increase time) Dynamic Standing - Comments: changing shirt in standing, no assist Extremity Assessment  RUE Assessment RUE Assessment: Within Functional Limits LUE Assessment LUE Assessment: Within Functional Limits RLE Assessment RLE Assessment: Within Functional Limits LLE Assessment LLE Assessment: Within Functional Limits   See Function Navigator for Current Functional Status.  Benjiman Core Tygielski 05/01/2016, 10:07 PM

## 2016-05-02 ENCOUNTER — Telehealth: Payer: Self-pay

## 2016-05-02 NOTE — Telephone Encounter (Signed)
1. Are you/is patient experiencing any problems since coming home? Are there any questions regarding any aspect of care? No issues.  2. Are there any questions regarding medications administration/dosing? Are meds being taken as prescribed? Patient should review meds with caller to confirm. Meds confirmed.  3. Have there been any falls? No falls.  4. Has Home Health been to the house and/or have they contacted you? If not, have you tried to contact them? Can we help you contact them? Home health RN has visited pt.  5. Are bowels and bladder emptying properly? Are there any unexpected incontinence issues? If applicable, is patient following bowel/bladder programs? No issues.  6. Any fevers, problems with breathing, unexpected pain? No issues. 7. Are there any skin problems or new areas of breakdown? No issues. 8. Has the patient/family member arranged specialty MD follow up (ie cardiology/neurology/renal/surgical/etc)?  Can we help arrange? Has follow ups scheduled.  9. Does the patient need any other services or support that we can help arrange? No 10. Are caregivers following through as expected in assisting the patient? Boyfriend.  11. Has the patient quit smoking, drinking alcohol, or using drugs as recommended? Pt is not smoking,       drinking alcohol, or using drugs.    Spoke with pt. She is aware of her appointment on 05/09/16 @ 10:30 am.

## 2016-05-02 NOTE — Telephone Encounter (Signed)
Pharmacist says that Silenor taken with Tramadol can cause increase risk of seizures and asking if we need to change medication.  Please advise.

## 2016-05-02 NOTE — Telephone Encounter (Signed)
May DC  tramadol  Use over-the-counter Tylenol when necessary pain

## 2016-05-04 NOTE — Discharge Summary (Signed)
Discharge summary job # [id number]

## 2016-05-04 NOTE — Discharge Summary (Signed)
Sharon Roberson, Sharon Roberson NO.:  [id number]  MEDICAL RECORD NO.:  [id number]  LOCATION:  4W21C                        FACILITY:  [facility]  PHYSICIAN:  Erick Colace, M.D.DATE OF BIRTH:  May 05, 1966  DATE OF ADMISSION:  04/25/2016 DATE OF DISCHARGE:  04/30/2016                              DISCHARGE SUMMARY   DISCHARGE DIAGNOSES: 1. 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 2. SCDs for DVT prophylaxis. 3. Pain management. 4. Hypertension. 5. Hyperlipidemia. 6. Tobacco, marijuana abuse.  HISTORY OF PRESENT ILLNESS:  This is a 50 year old right-handed female, history of hypertension, hyperlipidemia, tobacco abuse.  Per chart lives with significant other versus a friend, independent prior to admission. Presented April 23, 2016, with slurred speech, right-sided weakness. Urine drug screen positive for marijuana.  Cranial CT scan negative. MRI showed small acute infarct in the pons.  No evidence of major occlusion or significant proximal stenosis per MRA.  The patient did not receive tPA.  Echocardiogram with ejection fraction of 65%, grade 1 diastolic dysfunction.  Carotid Dopplers with no ICA stenosis. Cardiology Services consulted, placed on aspirin therapy, tolerating a regular diet.  The patient was admitted for comprehensive rehab program.  PAST MEDICAL HISTORY:  See discharge diagnoses.  SOCIAL HISTORY:  Lives with significant other.  Independent prior to admission.  Functional status upon admission to rehab services, minimal assist, 50 feet, one-person handheld assistance; minimal assist sit to stand; min- to-mod assist activities of daily living.  PHYSICAL EXAMINATION:  VITAL SIGNS:  Blood pressure 138/86, pulse 84, temperature 97, respirations 22.  GENERAL:  This is an alert female, oriented to person, place, and time. Fair awareness of deficits.  LUNGS:  Clear to auscultation without wheeze.  CARDIAC:  Regular rate and rhythm.  No  murmur.  ABDOMEN:  Soft, nontender.  Good bowel sounds.  REHABILITATION HOSPITAL COURSE:  Patient was admitted to inpatient rehab services with therapies initiated on a 3-hour daily basis, consisting of physical therapy, occupational therapy, speech therapy, and rehabilitation nursing.  The following issues were addressed during the patient's rehabilitation stay.  Pertaining to [name]'s left pontine infarct remained stable.  No bleeding episodes.  She would follow up Neurology Services.  SCDs for DVT prophylaxis.  Pain management with the use of Neurontin 300 mg at bedtime as well as sports cream and Robaxin as needed.  Mood stabilization with Cymbalta as well as Sinequan.  Blood pressures controlled.  Monitor on hydrochlorothiazide.  Close monitoring when out of bed.  Maintain on Zocor for hyperlipidemia.  Noted history of tobacco abuse.  Positive marijuana urine drug screen.  Maintained on a NicoDerm patch.  She received full counseling.  It was questionable she would be compliant with these requests.  The patient received weekly collaborative interdisciplinary team conferences to discuss estimated length of stay, family teaching, any barriers to her discharge.  The patient modified independent, ambulate 200 feet without assistive device.  She could gather her belongings for activities of daily living and homemaking.  She was discharged overall modified independent level. Full family teaching completed.  DISCHARGE MEDICATIONS:  At the time of dictation included aspirin 325 mg p.o. daily, Cymbalta 60 mg p.o. daily, Pepcid  40 mg p.o. daily, Flonase 1-2 sprays each nostril daily, Neurontin 300 mg p.o. at bedtime, hydrochlorothiazide 12.5 mg p.o. daily, Robaxin 500 mg 2 tablets by mouth every 8 hours as needed for muscle spasms, NicoDerm patch taper as directed, Silenor 3 mg p.o. daily, Zocor 80 mg p.o. daily, Ultram 50 mg 4 times daily as needed.  The patient would follow up Dr.  Allena Katz at the outpatient rehab service office as directed; Dr. Roda Shutters, Neurology Services; Nilda Riggs nurse practitioner, Medical Management.     Mariam Dollar, P.A.   ______________________________ Erick Colace, M.D.    DA/MEDQ  D:  05/04/2016  T:  05/04/2016  Job:  [id number]

## 2016-05-05 NOTE — Telephone Encounter (Signed)
Spoke with pharmacist. Advised per AK to D/C Tramadol and use OTC tylenol for pain.

## 2016-05-09 ENCOUNTER — Encounter: Payer: Self-pay | Admitting: Physical Medicine & Rehabilitation

## 2016-05-09 ENCOUNTER — Encounter: Payer: Medicare Other | Attending: Physical Medicine & Rehabilitation | Admitting: Physical Medicine & Rehabilitation

## 2016-05-09 VITALS — BP 127/88 | HR 93

## 2016-05-09 DIAGNOSIS — E785 Hyperlipidemia, unspecified: Secondary | ICD-10-CM | POA: Insufficient documentation

## 2016-05-09 DIAGNOSIS — I69354 Hemiplegia and hemiparesis following cerebral infarction affecting left non-dominant side: Secondary | ICD-10-CM | POA: Insufficient documentation

## 2016-05-09 DIAGNOSIS — I69359 Hemiplegia and hemiparesis following cerebral infarction affecting unspecified side: Secondary | ICD-10-CM

## 2016-05-09 DIAGNOSIS — M797 Fibromyalgia: Secondary | ICD-10-CM | POA: Diagnosis not present

## 2016-05-09 DIAGNOSIS — I10 Essential (primary) hypertension: Secondary | ICD-10-CM | POA: Insufficient documentation

## 2016-05-09 DIAGNOSIS — G894 Chronic pain syndrome: Secondary | ICD-10-CM | POA: Diagnosis not present

## 2016-05-09 DIAGNOSIS — Z5189 Encounter for other specified aftercare: Secondary | ICD-10-CM | POA: Diagnosis present

## 2016-05-09 DIAGNOSIS — E669 Obesity, unspecified: Secondary | ICD-10-CM | POA: Diagnosis not present

## 2016-05-09 NOTE — Progress Notes (Signed)
Subjective:    Patient ID: Sharon Roberson, female    DOB: Dec 21, 1965, 50 y.o.   MRN: [id number]  HPI  50 year old right-handed female, history of hypertension, hyperlipidemia, tobacco abuse presents for transitional care management after left pontine and left cingulate gyrus infarct.  DATE OF ADMISSION:  04/25/2016 DATE OF DISCHARGE:  04/30/2016 At discharge, pt was instructed to follow up with Neurology, which she has an appointment for.  She saw her PCP as well.  Pain is intermittent and exacerbated by her fibromyalgia.  Pt's complaint is soreness and weight gain.  Overall, she is doing well.  She is also concerned about fatigue.    DME: Shower chair Mobility: Independent Therapies: Per Pt, seen by [facility] therapies and told she needed outpt therapies  Pain Inventory Average Pain 7 Pain Right Now 8 My pain is ?  In the last 24 hours, has pain interfered with the following? General activity 4 Relation with others 4 Enjoyment of life 4 What TIME of day is your pain at its worst? daytime Sleep (in general) Poor  Pain is worse with: some activites Pain improves with: therapy/exercise and medication Relief from Meds: 5  Mobility walk without assistance how many minutes can you walk? 20 ability to climb steps?  yes do you drive?  no  Function disabled: date disabled . I need assistance with the following:  household duties  Neuro/Psych bladder control problems weakness depression  Prior Studies TC / hospital f/u  Physicians involved in your care TC/ hospital f/u   No family history on file. Social History   Social History  . Marital status: Unknown    Spouse name: N/A  . Number of children: N/A  . Years of education: N/A   Social History Main Topics  . Smoking status: Current Every Day Smoker    Packs/day: 0.50    Types: Cigarettes  . Smokeless tobacco: Never Used  . Alcohol use Yes     Comment: occasional  . Drug use: No  . Sexual activity: Not Asked    Other Topics Concern  . None   Social History Narrative  . None   No past surgical history on file. Past Medical History:  Diagnosis Date  . Hypercholesteremia   . Hypertension   . Pneumothorax   . Sarcoidosis (HCC)    BP 127/88 (BP Location: Left Arm, Patient Position: Sitting, Cuff Size: Large)   Pulse 93   SpO2 95%   Opioid Risk Score:   Fall Risk Score:  `1  Depression screen PHQ 2/9  No flowsheet data found.  Review of Systems  Constitutional: Positive for diaphoresis and unexpected weight change.  Eyes: Negative.   Respiratory: Positive for shortness of breath.   Cardiovascular: Negative.   Gastrointestinal: Negative.   Endocrine:       High blood sugar  Genitourinary: Negative.   Musculoskeletal: Positive for arthralgias and myalgias.  Allergic/Immunologic: Negative.   Neurological: Positive for weakness.  Hematological: Negative.   Psychiatric/Behavioral: Positive for dysphoric mood.  All other systems reviewed and are negative.      Objective:   Physical Exam HEENT: normocephalic, atraumatic Cardio: RRR and no murmur Resp: CTA B/L and unlabored GI: BS positive and NT/ND Skin:  Intact. Warm and dry. Neuro: Drowsy. Oriented DTRs brisk throughout Motor:  RUE: 4+/5 proximal to distal RLE: 4/5 proximal to distal LUE/LLE: 4+/5 throughout (?effort) No dysmetria  Musc/Skel:  No edema. Diffuse tenderness No increase in tone noted Gen NAD. Vital signs reviewed. Psych: Altered  affect    Assessment & Plan:  50 year old right-handed female, history of hypertension, hyperlipidemia, tobacco abuse presents for transitional care management after left pontine and left cingulate gyrus infarct.   1. 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 Pain Management  Will decrease Neurontin to 100qhs due to ?lethargy   3.  Hypertension  Controlled at present  4. Obesity  Dietitian referral   Encouraged activity and weight loss.  Discouraged use of "diet pill"  5. Fibromyalgia  Pt wishes to see [facility] for treatment.  She will discuss with her PCP, who is currently managing   Medications reviewed Referrals reviewed All questions answered

## 2016-05-14 ENCOUNTER — Telehealth: Payer: Self-pay | Admitting: *Deleted

## 2016-05-14 DIAGNOSIS — I69359 Hemiplegia and hemiparesis following cerebral infarction affecting unspecified side: Secondary | ICD-10-CM

## 2016-05-14 NOTE — Telephone Encounter (Signed)
Switch to [employer] schedule from [employer] and refer to outpt PT [facility]-done

## 2016-05-20 ENCOUNTER — Ambulatory Visit: Payer: Medicare Other | Attending: Physical Medicine & Rehabilitation

## 2016-06-19 ENCOUNTER — Encounter: Payer: Self-pay | Admitting: Nurse Practitioner

## 2016-06-19 ENCOUNTER — Ambulatory Visit (INDEPENDENT_AMBULATORY_CARE_PROVIDER_SITE_OTHER): Payer: Medicare Other | Admitting: Nurse Practitioner

## 2016-06-19 ENCOUNTER — Telehealth: Payer: Self-pay | Admitting: *Deleted

## 2016-06-19 VITALS — BP 152/104 | HR 88 | Ht <= 58 in | Wt 176.2 lb

## 2016-06-19 DIAGNOSIS — R400 Somnolence: Secondary | ICD-10-CM | POA: Insufficient documentation

## 2016-06-19 DIAGNOSIS — M6289 Other specified disorders of muscle: Secondary | ICD-10-CM | POA: Diagnosis not present

## 2016-06-19 DIAGNOSIS — R531 Weakness: Secondary | ICD-10-CM

## 2016-06-19 DIAGNOSIS — I10 Essential (primary) hypertension: Secondary | ICD-10-CM | POA: Diagnosis not present

## 2016-06-19 DIAGNOSIS — G4710 Hypersomnia, unspecified: Secondary | ICD-10-CM | POA: Diagnosis not present

## 2016-06-19 DIAGNOSIS — E785 Hyperlipidemia, unspecified: Secondary | ICD-10-CM | POA: Diagnosis not present

## 2016-06-19 DIAGNOSIS — I639 Cerebral infarction, unspecified: Secondary | ICD-10-CM

## 2016-06-19 DIAGNOSIS — I6350 Cerebral infarction due to unspecified occlusion or stenosis of unspecified cerebral artery: Secondary | ICD-10-CM | POA: Diagnosis not present

## 2016-06-19 NOTE — Telephone Encounter (Signed)
Faxed request for MR to pts pcp.  Dr. Rosanne SackJennifer Burke.

## 2016-06-19 NOTE — Progress Notes (Addendum)
[facility]  PATIENT: Sharon Roberson DOB: 02/19/1966   REASON FOR VISIT: Hospital follow-up for left pontine infarct HISTORY FROM: Patient and friend Mitch    HISTORY OF PRESENT ILLNESS:Sharon Roberson is a 50 y.o. female with right sided weakness that was present on awakening 04/23/16.  Her daughter noticed that her speech was slurred a little bit later and therefore EMS was called. 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 She is not sure of any symptoms of neurosarcoidosis that she has other than headaches. MRI  left pontine infarct.  left cingulate gyrus linear DWI changes suspicious for infarct but atypical. MRA unremarkable  Carotid Doppler unremarkable 2D Echo  EF 60-65 Due to atypical linear DWI changes at left cingulate gyrus, will recommend 30 day cardiac event monitoring as outpt. Please arrange on discharge. LDL 147  HgbA1c 5.6 she returns to the clinic today for follow-up visit. She denies further stroke or TIA symptoms. She is currently on aspirin for secondary stroke prevention. She is on Zocor 80 mg daily without complaints of myalgias. She had a fall several weeks ago going up steps, she continues to have mild lower extremity weakness. She has not had cardiac monitoring 30 day event arranged as yet.  She did quit smoking and was congratulated on this. She also complains today of morning headache daytime drowsiness and her partner says she snores at night. She has never had a sleep study. She returns for reevaluation   REVIEW OF SYSTEMS: Full 14 system review of systems performed and notable only for those listed, all others are neg:  Constitutional: Fatigue  Cardiovascular:  neg Ear/Nose/Throat: neg  Skin: neg Eyes: Blurred vision Respiratory: neg Gastroitestinal: neg  Hematology/Lymphatic: neg  Endocrine: neg Musculoskeletal: Walking difficulty Allergy/Immunology: neg Neurological: Headache, weakness Psychiatric: Depression Sleep : Daytime sleepiness   ALLERGIES: Allergies  Allergen Reactions  . Penicillins Anaphylaxis    Has patient had a PCN reaction causing immediate rash, facial/tongue/throat swelling, SOB or lightheadedness with hypotension: Yes Has patient had a PCN reaction causing severe rash involving mucus membranes or skin necrosis: No Has patient had a PCN reaction that required hospitalization Yes Has patient had a PCN reaction occurring within the last 10 years: Yes If all of the above answers are "NO", then may proceed with Cephalosporin use.   . Sulfamethoxazole Anaphylaxis  . Trazodone And Nefazodone Other (See Comments)    Hypotension, diaphoretic    HOME MEDICATIONS: Outpatient Medications Prior to Visit  Medication Sig Dispense Refill  . aspirin 325 MG tablet Take 1 tablet (325 mg total) by mouth daily. 30 tablet 0  . Doxepin HCl (SILENOR) 3 MG TABS Take 3 mg by mouth daily.    . DULoxetine (CYMBALTA) 60 MG capsule Take 60 mg by mouth daily.    . famotidine (PEPCID) 40 MG tablet Take 1 tablet (40 mg total) by mouth daily. 30 tablet 0  . gabapentin (NEURONTIN) 300 MG capsule Take 100 mg by mouth at bedtime.    . hydrochlorothiazide (HYDRODIURIL) 12.5 MG tablet Take 1 tablet (12.5 mg total) by mouth every morning. 30 tablet 0  . methocarbamol (ROBAXIN) 500 MG tablet Take 2 tablets (1,000 mg total) by mouth every 8 (eight) hours as  needed for muscle spasms. 100 tablet 0  . pantoprazole (PROTONIX) 20 MG tablet Take 20 mg by mouth daily.    . simvastatin (ZOCOR) 80 MG tablet Take 1 tablet (80 mg total) by mouth at bedtime. 30 tablet 0  . traMADol (ULTRAM) 50 MG tablet Take 1 tablet (50 mg total) by mouth 4 (four) times daily as  needed for moderate pain. 60 tablet 0  . Menthol-Methyl Salicylate (MUSCLE RUB) 10-15 % CREA Apply 1 application topically 2 (two) times daily as needed for muscle pain. (Patient not taking: Reported on 06/19/2016)  0  . senna-docusate (SENOKOT-S) 8.6-50 MG tablet Take 1 tablet by mouth at bedtime as needed for mild constipation. (Patient not taking: Reported on 06/19/2016) 30 tablet 0   No facility-administered medications prior to visit.     PAST MEDICAL HISTORY: Past Medical History:  Diagnosis Date  . Hypercholesteremia   . Hypertension   . Pneumothorax   . Sarcoidosis (HCC)   . Stroke [facility])     PAST SURGICAL HISTORY: History reviewed. No pertinent surgical history.  FAMILY HISTORY: History reviewed. No pertinent family history.  SOCIAL HISTORY: Social History   Social History  . Marital status: Unknown    Spouse name: N/A  . Number of children: N/A  . Years of education: N/A   Occupational History  . Not on file.   Social History Main Topics  . Smoking status: Former Smoker    Packs/day: 0.50    Types: Cigarettes    Quit date: 04/23/2016  . Smokeless tobacco: Never Used  . Alcohol use Yes     Comment: occasional  . Drug use:      Comment: marijuana: occasional  . Sexual activity: Not on file   Other Topics Concern  . Not on file   Social History Narrative   Right handed.   Caffeine some (soda every now and then)   Living home. One child (27 yr)   Disabled.       PHYSICAL EXAM  Vitals:   06/19/16 0933  BP: (!) 152/104  Pulse: 88  Weight: 176 lb 3.2 oz (79.9 kg)  Height: 4\' 10"  (1.473 m)   Body mass index is 36.83 kg/m.  Generalized: Well developed, Obese female in no acute distress  Head: normocephalic and atraumatic,. Oropharynx benign  Neck: Supple, no carotid bruits  Cardiac: Regular rate rhythm, no murmur  Musculoskeletal: No deformity   Neurological examination   Mentation: Alert oriented to time, place, history taking. Attention  span and concentration appropriate. Recent and remote memory intact.  Follows all commands speech and language fluent. [id number], [id number].  Cranial nerve II-XII: Fundoscopic exam reveals sharp disc margins.Pupils were equal round reactive to light extraocular movements were full, visual field were full on confrontational test. Facial sensation and strength were normal. hearing was intact to finger rubbing bilaterally. Uvula tongue midline. head turning and shoulder shrug were normal and symmetric.Tongue protrusion into cheek strength was normal. Motor: normal bulk and tone, full strength in the BUE, mild  weakness in the right lower extremity4/5 Sensory: normal and symmetric to light touch, pinprick, and  Vibration,  Coordination: finger-nose-finger, heel-to-shin bilaterally, no dysmetria Reflexes: Brachioradialis 2/2, biceps 2/2, triceps 2/2, patellar 2/2, Achilles 2/2, plantar responses were flexor bilaterally. Gait and Station: Rising up from seated position without assistance, normal stance,  moderate stride, good arm swing, smooth turning, able to perform tiptoe, and heel walking without difficulty. Tandem gait is unsteady  DIAGNOSTIC DATA (LABS, IMAGING, TESTING) - I reviewed  patient records, labs, notes, testing and imaging myself where available.  Lab Results  Component Value Date   WBC 6.0 04/28/2016   HGB 14.7 04/28/2016   HCT 43.1 04/28/2016   MCV 91.3 04/28/2016   PLT 232 04/28/2016      Component Value Date/Time   NA 138 04/28/2016 0706   K 3.6 04/28/2016 0706   CL 103 04/28/2016 0706   CO2 27 04/28/2016 0706   GLUCOSE 103 (H) 04/28/2016 0706   BUN 10 04/28/2016 0706   CREATININE 0.73 04/28/2016 0706   CALCIUM 9.6 04/28/2016 0706   PROT 8.3 (H) 04/28/2016 0706   ALBUMIN 4.0 04/28/2016 0706   AST 24 04/28/2016 0706   ALT 17 04/28/2016 0706   ALKPHOS 88 04/28/2016 0706   BILITOT 0.6 04/28/2016 0706   GFRNONAA >60 04/28/2016 0706   GFRAA >60 04/28/2016 0706   Lab Results    Component Value Date   CHOL 251 (H) 04/24/2016   HDL 54 04/24/2016   LDLCALC 147 (H) 04/24/2016   TRIG 250 (H) 04/24/2016   CHOLHDL 4.6 04/24/2016   Lab Results  Component Value Date   HGBA1C 5.6 04/24/2016    ASSESSMENT AND PLAN  50 y.o. year old female  has a past medical history of Hypercholesteremia; Hypertension; Pneumothorax; Sarcoidosis (HCC); and Stroke (HCC). here To follow-up for left pontine infarct  New complaint  of daytime drowsiness. Continue weakness right lower extremity The patient is a current patient of Dr. Roda Shutters  who is out of the office today . This note is sent to the work in doctor.     PLAN: Stressed the importance of management of risk factors to prevent further stroke Continue Aspirin for secondary stroke prevention Maintain strict control of hypertension with blood pressure goal below 130/90,Continue antihypertensive medications Control of diabetes with hemoglobin A1c below 6.5 followed by primary care most recent hemoglobin A1c 5.6  Cholesterol with LDL cholesterol less than 70, followed by primary care,  most recent 147 continue statin drugs Simvastatin Will obtain a 30 day cardiac event monitoring Will make [facility] referral for PT for lower extremity weakness Will get sleep study After therapy Exercise by walking, water aerobics, YOGA eat healthy diet with whole grains,  fresh fruits and vegetables no fast food , junk food Follow up  In 3 months Discussed risk for recurrent stroke/ TIA and answered additional questions This was a prolonged visit requiring 45 minutes and medical decision making of high complexity with extensive review of history, hospital chart, counseling and answering questions Nilda Riggs, [facility], [facility], APRN  [facility] [address], [address] [place], [place] [address] [phone]  I reviewed the above note and documentation by the Nurse Practitioner and agree with the history, physical exam, assessment and plan  as outlined above. I was immediately available for face-to-face consultation. Huston Foley, MD, PhD [facility])

## 2016-06-19 NOTE — Patient Instructions (Addendum)
Stressed the importance of management of risk factors to prevent further stroke Continue Aspirin for secondary stroke prevention Maintain strict control of hypertension with blood pressure goal below 130/90,Continue antihypertensive medications Control of diabetes with hemoglobin A1c below 6.5 followed by primary care most recent hemoglobin A1c 5.6  Cholesterol with LDL cholesterol less than 70, followed by primary care,  most recent 147 continue statin drugs Simvastatin Will make HH referral for PT  Will get sleep study After therapy Exercise by walking, water aerobics, YOGA eat healthy diet with whole grains,  fresh fruits and vegetables no fast food , junk food Follow up  In 3 months

## 2016-06-24 ENCOUNTER — Encounter: Payer: Self-pay | Admitting: Neurology

## 2016-06-24 ENCOUNTER — Telehealth: Payer: Self-pay | Admitting: Neurology

## 2016-06-24 ENCOUNTER — Ambulatory Visit (INDEPENDENT_AMBULATORY_CARE_PROVIDER_SITE_OTHER): Payer: Medicare Other | Admitting: Neurology

## 2016-06-24 VITALS — BP 118/82 | HR 82 | Resp 16 | Ht <= 58 in | Wt 176.0 lb

## 2016-06-24 DIAGNOSIS — R0683 Snoring: Secondary | ICD-10-CM

## 2016-06-24 DIAGNOSIS — R351 Nocturia: Secondary | ICD-10-CM

## 2016-06-24 DIAGNOSIS — G4710 Hypersomnia, unspecified: Secondary | ICD-10-CM

## 2016-06-24 DIAGNOSIS — I639 Cerebral infarction, unspecified: Secondary | ICD-10-CM

## 2016-06-24 DIAGNOSIS — R51 Headache: Secondary | ICD-10-CM | POA: Diagnosis not present

## 2016-06-24 DIAGNOSIS — M6289 Other specified disorders of muscle: Secondary | ICD-10-CM

## 2016-06-24 DIAGNOSIS — G4761 Periodic limb movement disorder: Secondary | ICD-10-CM

## 2016-06-24 DIAGNOSIS — R531 Weakness: Secondary | ICD-10-CM

## 2016-06-24 DIAGNOSIS — R519 Headache, unspecified: Secondary | ICD-10-CM

## 2016-06-24 DIAGNOSIS — I6350 Cerebral infarction due to unspecified occlusion or stenosis of unspecified cerebral artery: Secondary | ICD-10-CM | POA: Diagnosis not present

## 2016-06-24 NOTE — Progress Notes (Signed)
Subjective:    Patient ID: Sharon Roberson is a 50 y.o. female.  HPI     Sharon Foley, MD, PhD [facility] [address], [address] [address] [place], [place] [address]  Dear Sharon Roberson and Sharon Roberson,   I saw your patient, Sharon Roberson, upon your kind request in my clinic today for initial consultation of her sleep disorder, in particular, concern for underlying obstructive sleep apnea. The patient is accompanied by her BF today. 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 She also has trouble falling asleep and staying asleep. Her Epworth sleepiness score is 9 out of 24 today, her fatigue score is 38 out of 63. Bedtime is around 10 PM, she watches TV in bed for at least an hour, TV can stay on all night but sometimes she turns it off when she wakes up in the middle of the night. She reports a family history of OSA in her brother. She denies restless leg symptoms but boyfriend reports that she twitches and moves her legs in bed and while asleep. She quit smoking in July 2017. She drinks alcohol occasionally, maybe once a month, she smokes marijuana frequently, as much as she can afford it she states. This averages a few times a week. She does not typically drink caffeine on a daily basis. I reviewed your office note from 06/19/2016.  Her Past Medical History Is Significant For: Past Medical History:  Diagnosis Date  . Hypercholesteremia   . Hypertension   . Pneumothorax   . Sarcoidosis (HCC)   . Stroke [facility])     Her Past Surgical History Is Significant For: Past Surgical History:  Procedure Laterality Date  . KIDNEY STONE SURGERY     1999,2000,2001,2002,2003,2004  . LUNG SURGERY  2003  . THYROID SURGERY       Her Family History Is Significant For: Family History  Problem Relation Age of Onset  . Kidney cancer Mother     died 40yr  (had kidney transplant)  . Lung cancer Mother   . Stroke Father     died 62y  Gallstones  . Hypertension Sister   . Thyroid disease Sister   . Bone cancer Brother     stem cell transplant  . Diabetes Brother   . Hypertension Brother   . Heart failure Sister   . Hypertension Sister     Her Social History Is Significant For: Social History   Social History  . Marital status: Significant Other    Spouse name: N/A  . Number of children: 1  . Years of education: college   Occupational History  . Disability     Social History Main Topics  . Smoking status: Former Smoker    Packs/day: 0.50    Types: Cigarettes    Quit date: 04/23/2016  . Smokeless tobacco: Never Used  . Alcohol use Yes     Comment: occasional  . Drug use:     Types: Marijuana     Comment: marijuana: occasional  . Sexual activity: Not Asked   Other Topics Concern  . None   Social History Narrative   Right handed.   Caffeine some (soda every now and then)   Living home. One child (27 yr)   Disabled.      Her Allergies  Are:  Allergies  Allergen Reactions  . Penicillins Anaphylaxis    Has patient had a PCN reaction causing immediate rash, facial/tongue/throat swelling, SOB or lightheadedness with hypotension: Yes Has patient had a PCN reaction causing severe rash involving mucus membranes or skin necrosis: No Has patient had a PCN reaction that required hospitalization Yes Has patient had a PCN reaction occurring within the last 10 years: Yes If all of the above answers are "NO", then may proceed with Cephalosporin use.   . Sulfamethoxazole Anaphylaxis  . Trazodone And Nefazodone Other (See Comments)    Hypotension, diaphoretic  :   Her Current Medications Are:  Outpatient Encounter Prescriptions as of 06/24/2016  Medication Sig  . aspirin 325 MG tablet Take 1  tablet (325 mg total) by mouth daily.  . Doxepin HCl (SILENOR) 3 MG TABS Take 3 mg by mouth daily.  . DULoxetine (CYMBALTA) 60 MG capsule Take 60 mg by mouth daily.  . famotidine (PEPCID) 40 MG tablet Take 1 tablet (40 mg total) by mouth daily.  Marland Kitchen. gabapentin (NEURONTIN) 300 MG capsule Take 100 mg by mouth at bedtime.  . hydrochlorothiazide (HYDRODIURIL) 12.5 MG tablet Take 1 tablet (12.5 mg total) by mouth every morning.  . methocarbamol (ROBAXIN) 500 MG tablet Take 2 tablets (1,000 mg total) by mouth every 8 (eight) hours as needed for muscle spasms.  . pantoprazole (PROTONIX) 20 MG tablet Take 20 mg by mouth daily.  . simvastatin (ZOCOR) 80 MG tablet Take 1 tablet (80 mg total) by mouth at bedtime.  . traMADol (ULTRAM) 50 MG tablet Take 1 tablet (50 mg total) by mouth 4 (four) times daily as needed for moderate pain.   No facility-administered encounter medications on file as of 06/24/2016.   :  Review of Systems:  Out of a complete 14 point review of systems, all are reviewed and negative with the exception of these symptoms as listed below: Review of Systems  Neurological:       Patient has trouble falling and staying asleep, snoring, wakes up feeling tired, morning headaches, daytime tiredness, decreased energy, takes naps.   Epworth Sleepiness Scale 0= would never doze 1= slight chance of dozing 2= moderate chance of dozing 3= high chance of dozing  Sitting and reading:0 Watching TV:2 Sitting inactive in a public place (ex. Theater or meeting):2 As a passenger in a car for an hour without a break:2 Lying down to rest in the afternoon:2 Sitting and talking to someone:0 Sitting quietly after lunch (no alcohol):1 In a car, while stopped in traffic:0 Total:9   Objective:  Neurologic Exam  Physical Exam Physical Examination:   Vitals:   06/24/16 0854  BP: 118/82  Pulse: 82  Resp: 16   General Examination: The patient is a very pleasant 50 y.o. female in no acute  distress. She appears well-developed and well-nourished and well groomed.   HEENT: Normocephalic, atraumatic, pupils are equal, round and reactive to light and accommodation. Funduscopic exam is normal with sharp disc margins noted. Extraocular tracking is good without limitation to gaze excursion or nystagmus noted. Normal smooth pursuit is noted. Hearing is grossly intact. Tympanic membranes are clear bilaterally. Face is symmetric with normal facial animation and normal facial sensation. Speech is clear with no dysarthria noted. There is no hypophonia. There is no lip, neck/head, jaw or voice tremor. Neck is supple with full range of passive and active motion. There are no carotid bruits on auscultation. Oropharynx exam reveals: mild mouth dryness, adequate dental hygiene and  moderate airway crowding, due to larger tongue and longer uvula, tonsils in place, about 1+. Mallampati is class II. Tongue protrudes centrally and palate elevates symmetrically. Neck size is 15.75 inches. She has a Mild overbite.   Chest: Clear to auscultation without wheezing, rhonchi or crackles noted.  Heart: S1+S2+0, regular and normal without murmurs, rubs or gallops noted.   Abdomen: Soft, non-tender and non-distended with normal bowel sounds appreciated on auscultation.  Extremities: There is no pitting edema in the distal lower extremities bilaterally. Pedal pulses are intact.  Skin: Warm and dry without trophic changes noted. There are no varicose veins.  Musculoskeletal: exam reveals no obvious joint deformities, tenderness or joint swelling or erythema.   Neurologically:  Mental status: The patient is awake, alert and oriented in all 4 spheres. Her immediate and remote memory, attention, language skills and fund of knowledge are appropriate. There is no evidence of aphasia, agnosia, apraxia or anomia. Speech is clear with normal prosody and enunciation. Thought process is linear. Mood is normal and affect is  normal.  Cranial nerves II - XII are as described above under HEENT exam. In addition: shoulder shrug is normal with equal shoulder height noted. Motor exam: Normal bulk, strength and tone is noted with the exception of very slight right upper and right lower extremity weakness. There is no drift, tremor or rebound. Romberg is negative. Reflexes are 2+ on the L and brisker on the R. Babinski: Toes are flexor bilaterally. Fine motor skills and coordination: intact with normal finger taps, normal hand movements, normal rapid alternating patting, normal foot taps and normal foot agility.  Cerebellar testing: No dysmetria or intention tremor on finger to nose testing. Heel to shin is unremarkable bilaterally. There is no truncal or gait ataxia.  Sensory exam: intact to light touch, pinprick, vibration, temperature sense in the upper and lower extremities.  Gait, station and balance: She stands easily. No veering to one side is noted. No leaning to one side is noted. Posture is age-appropriate and stance is narrow based. Gait shows normal stride length and normal pace. No problems turning are noted. She cannot do Tandem walk.               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 Especially, In light of her history of stroke at a young age, sleep apnea evaluation as crucial.  I had a long chat with the patient and her BF about my findings and the diagnosis of OSA, its prognosis and treatment options. We talked about medical treatments, surgical interventions and non-pharmacological approaches. I explained in particular the risks and ramifications of untreated moderate to severe OSA, especially with respect to  developing cardiovascular disease down the Road, including congestive heart failure, difficult to treat hypertension, cardiac arrhythmias, or stroke. Even type 2 diabetes has, in part, been linked to untreated OSA. Symptoms of untreated OSA include daytime sleepiness, memory problems, mood irritability and mood disorder such as depression and anxiety, lack of energy, as well as recurrent headaches, especially morning headaches. We talked about trying to maintain a healthy lifestyle in general, as well as the importance of weight control. I encouraged the patient to eat healthy, exercise daily and keep well hydrated, to keep a scheduled bedtime and  wake time routine, to not skip any meals and eat healthy snacks in between meals. I advised the patient not to drive when feeling sleepy. In addition, we talked about the importance of smoking cessation, improvement of sleep hygiene, and avoiding any illicit drugs. I recommended the following at this time: sleep study with potential positive airway pressure titration. (We will score hypopneas at 4% and split the sleep study into diagnostic and treatment portion, if the estimated. 2 hour AHI is >15/h).   I explained the sleep test procedure to the patient and also outlined possible surgical and non-surgical treatment options of OSA, including the use of a custom-made dental device (which would require a referral to a specialist dentist or oral surgeon), upper airway surgical options, such as pillar implants, radiofrequency surgery, tongue base surgery, and UPPP (which would involve a referral to an ENT surgeon). Rarely, jaw surgery such as mandibular advancement may be considered.  I also explained the CPAP treatment option to the patient, who indicated that she would be willing to try CPAP if the need arises. I explained the importance of being compliant with PAP treatment, not only for insurance purposes but primarily to improve Her symptoms, and for the patient's  long term health benefit, including to reduce Her cardiovascular risks. I answered all their questions today and the patient and her BF were in agreement. I would like to see her back after the sleep study is completed and encouraged her to call with any interim questions, concerns, problems or updates.   Thank you very much for allowing me to participate in the care of this nice patient. If I can be of any further assistance to you please do not hesitate to talk to me.  Sincerely,   Sharon Foley, MD, PhD

## 2016-06-24 NOTE — Patient Instructions (Signed)

## 2016-06-24 NOTE — Telephone Encounter (Signed)
Karen/[facility] [phone] called to advise they rec'd the referral to start PT but the pt refused it yesterday and wanst to start tomorrow. FYI

## 2016-06-24 NOTE — Telephone Encounter (Signed)
noted 

## 2016-06-26 ENCOUNTER — Ambulatory Visit (INDEPENDENT_AMBULATORY_CARE_PROVIDER_SITE_OTHER): Payer: Medicare Other

## 2016-06-26 DIAGNOSIS — I6350 Cerebral infarction due to unspecified occlusion or stenosis of unspecified cerebral artery: Secondary | ICD-10-CM | POA: Diagnosis not present

## 2016-06-26 DIAGNOSIS — I4891 Unspecified atrial fibrillation: Secondary | ICD-10-CM

## 2016-06-26 DIAGNOSIS — I639 Cerebral infarction, unspecified: Secondary | ICD-10-CM

## 2016-06-26 NOTE — Telephone Encounter (Signed)
Victorino DikeJennifer, PT/[facility] [phone] called to request verbal order for PT, twice a week x 8 weeks, and referral for OT services for Right upper strengthening, and ADL re-training.

## 2016-06-26 NOTE — Telephone Encounter (Signed)
I thought this was already ordered when I saw her last week.

## 2016-06-26 NOTE — Telephone Encounter (Signed)
I spoke to PasatiempoJennifer and relayed that ok for PT plan and OT referral.  She will send over plan of care for signature.

## 2016-07-01 ENCOUNTER — Institutional Professional Consult (permissible substitution): Payer: Medicare Other | Admitting: Neurology

## 2016-07-02 ENCOUNTER — Other Ambulatory Visit: Payer: Self-pay | Admitting: Nurse Practitioner

## 2016-07-02 DIAGNOSIS — I4891 Unspecified atrial fibrillation: Secondary | ICD-10-CM

## 2016-07-02 DIAGNOSIS — I6350 Cerebral infarction due to unspecified occlusion or stenosis of unspecified cerebral artery: Secondary | ICD-10-CM

## 2016-07-02 DIAGNOSIS — I639 Cerebral infarction, unspecified: Secondary | ICD-10-CM

## 2016-07-10 ENCOUNTER — Telehealth: Payer: Self-pay

## 2016-07-10 NOTE — Telephone Encounter (Signed)
Rn call [facility] about plan of care forms. Rn explain the form has Dr. Frances FurbishAthar name on It. Rn stated the pt had a stroke and the form needs to be change to Dr. Marvel PlanJindong Xu name. Rn explain Dr. Frances FurbishAthar is the sleep md who evaluate the pt for sleep disorders only.  Dr. Roda ShuttersXu is the stroke MD. Clydie BraunKaren will change the name to Dr. Roda ShuttersXu and refax over. Rn gave number of [phone] to fax.

## 2016-07-17 DIAGNOSIS — H02823 Cysts of right eye, unspecified eyelid: Secondary | ICD-10-CM

## 2016-07-17 HISTORY — DX: Cysts of right eye, unspecified eyelid: H02.823

## 2016-07-21 ENCOUNTER — Ambulatory Visit (INDEPENDENT_AMBULATORY_CARE_PROVIDER_SITE_OTHER): Payer: Medicare Other | Admitting: Neurology

## 2016-07-21 DIAGNOSIS — G4710 Hypersomnia, unspecified: Secondary | ICD-10-CM

## 2016-07-21 DIAGNOSIS — G4720 Circadian rhythm sleep disorder, unspecified type: Secondary | ICD-10-CM

## 2016-07-21 DIAGNOSIS — G4733 Obstructive sleep apnea (adult) (pediatric): Secondary | ICD-10-CM

## 2016-07-21 DIAGNOSIS — G4761 Periodic limb movement disorder: Secondary | ICD-10-CM

## 2016-07-31 ENCOUNTER — Telehealth: Payer: Self-pay | Admitting: Neurology

## 2016-07-31 NOTE — Telephone Encounter (Signed)
I spoke to patient and she is aware of results. I was able to make her f/u appt.

## 2016-07-31 NOTE — Progress Notes (Signed)
PATIENT'S NAME:  Sharon Roberson, Sharon Roberson DOB:      10/19/1965      MR#:    [id number]     DATE OF RECORDING: 07/21/2016 REFERRING M.D.:  Darrol Angelarolyn Martin, NP Study Performed:   Baseline Polysomnogram HISTORY: 50 year old woman with hypertension, hyperlipidemia, sarcoidosis, left pontine infarct in July 2017, and obesity, who reports snoring and excessive daytime somnolence, as well as morning headaches. She also has trouble falling asleep and staying asleep. The patient endorsed the Epworth Sleepiness Scale at 9/24 points. The patient's weight 176 pounds with a height of 58 (inches), resulting in a BMI of 37. kg/m2. The patient's neck circumference measured 15 inches.  CURRENT MEDICATIONS: Aspirin, Silenor, Cymbalta, Pepcid, Neurontin, Hydrodiuril, Robaxin, Protonix, Zocor, Ultram.   PROCEDURE:  This is a multichannel digital polysomnogram utilizing the Somnostar 11.2 system.  Electrodes and sensors were applied and monitored per AASM Specifications.   EEG, EOG, Chin and Limb EMG, were sampled at 200 Hz.  ECG, Snore and Nasal Pressure, Thermal Airflow, Respiratory Effort, CPAP Flow and Pressure, Oximetry was sampled at 50 Hz. Digital video and audio were recorded.      BASELINE STUDY  Lights Out was at 20:43 and Lights On at 04:47.  Total recording time (TRT) was 485 minutes, with a total sleep time (TST) of  403.5 minutes.   The patient's sleep latency was 47.5 minutes, which is prolonged.  REM latency was 124 minutes, which is mildly prolonged.  The sleep efficiency was 83.2 %, which is mildly reduced.     SLEEP ARCHITECTURE: WASO (Wake after sleep onset) was 33.5 minutes with moderate sleep fragmentation noted.  There were 39 minutes in Stage N1, 282.5 minutes Stage N2, 39.5 minutes Stage N3 and 42.5 minutes in Stage REM.  The percentage of Stage N1 was 9.7%, which is increased, Stage N2 was 70.%, which is increased, Stage N3 was 9.8% and Stage R (REM sleep) was 10.5%, which is reduced.   The video and audio  analysis did not show abnormal or unusual behaviors, movements, phonations or vocalizations. The patient did not take any bathroom breaks during this study.  RESPIRATORY ANALYSIS:  There were a total of 31 respiratory events:  16 obstructive apneas, 6 central apneas and 0 mixed apneas with a total of 22 apneas and an apnea index (AI) of 3.3 /hour. There were 9 hypopneas with a hypopnea index of 1.3 /hour. The patient also had 0 respiratory event related arousals (RERAs).      The total APNEA/HYPOPNEA INDEX (AHI) was 4.6/hour, which is borderline and the total RESPIRATORY DISTURBANCE INDEX was 4.6 /hour.  0 events occurred in REM sleep and 24 events in NREM. The REM AHI was 0 /hour, versus a non-REM AHI of 5.2. The patient spent 37.5 minutes of total sleep time in the supine position and 366 minutes in non-supine.. The supine AHI was 28.8 versus a non-supine AHI of 2.1. Mild to moderate snoring was noted throughout the study.  OXYGEN SATURATION & C02:  The Wake baseline 02 saturation was 98%, with the lowest being 91%. Time spent below 89% saturation equaled 0 minutes.   PERIODIC LIMB MOVEMENTS:   The patient had a total of 166 Periodic Limb Movements.  The Periodic Limb Movement (PLM) index was 24.7, which is mildly increased, and the PLM Arousal index was 4.3/hour.  IMPRESSION:  1.  Obstructive Sleep Apnea  2.  Dysfunctions associated with sleep stages or arousal from sleep  3.  Periodic Limb Movement Disorder   RECOMMENDATIONS:  1. This study demonstrates an overall high normal AHI and supine obstructive sleep apnea, with oxygen saturations maintained in the 90s. For this, PAP (positive airway pressure) therapy is not warranted. Moderate snoring was noted at times. Weight loss and avoidance of the supine sleep position may help snoring and her supine OSA. Other treatment options for snoring may include avoidance of upper airway or jaw surgery in selected patients or the use of an oral  appliance in certain patients. ENT evaluation and/or consultation with a maxillofacial surgeon or dentist may be feasible and some instances.  2. This study shows sleep fragmentation and abnormal sleep stage percentages; these are nonspecific findings and per se do not signify an intrinsic sleep disorder or a cause for the patient's sleep-related symptoms. Causes include (but are not limited to) the first night effect of the sleep study, circadian rhythm disturbances, medication effect or an underlying mood disorder or medical problem.  3. Mild PLMs (periodic limb movements of sleep) were noted during the study, without significant arousals; clinical correlation is recommended. Medication effect from the patient's antidepressant medication should be considered.  4. The patient should be cautioned not to drive, work at heights, or operate dangerous or heavy equipment when tired or sleepy. Review and reiteration of good sleep hygiene measures should be pursued with any patient. 5. The patient will be seen in follow-up by Dr. Frances Furbish at [facility] for discussion of the test results and further management strategies. The referring providers will be notified of the test results.   I certify that I have reviewed the entire raw data recording prior to the issuance of this report in accordance with the Standards of Accreditation of the American Academy of Sleep Medicine (AASM)   Huston Foley, MD,PhD Diplomat, American Board of Psychiatry and Neurology  Diplomat, American Board of Sleep Medicine

## 2016-07-31 NOTE — Telephone Encounter (Signed)
Patient referred by Dr. Roda ShuttersXu and CM, seen by me on 06/24/16, diagnostic PSG on 07/21/16.   Please call and notify the patient that the recent sleep study did not show any significant obstructive sleep apnea. Please inform patient that I would like to go over the details of the study during a follow up appointment. Arrange a followup appointment. Also, route or fax report to PCP and referring MD, if other than PCP.  Once you have spoken to patient, you can close this encounter.   Thanks,  Huston FoleySaima Taniyah Ballow, MD, PhD [facility]

## 2016-08-06 ENCOUNTER — Encounter (INDEPENDENT_AMBULATORY_CARE_PROVIDER_SITE_OTHER): Payer: Self-pay

## 2016-08-06 ENCOUNTER — Encounter: Payer: Self-pay | Admitting: Physical Medicine & Rehabilitation

## 2016-08-06 ENCOUNTER — Telehealth: Payer: Self-pay | Admitting: Physical Medicine & Rehabilitation

## 2016-08-06 ENCOUNTER — Encounter: Payer: Medicare Other | Attending: Physical Medicine & Rehabilitation | Admitting: Physical Medicine & Rehabilitation

## 2016-08-06 VITALS — BP 149/97 | HR 102 | Wt 180.4 lb

## 2016-08-06 DIAGNOSIS — G894 Chronic pain syndrome: Secondary | ICD-10-CM | POA: Diagnosis not present

## 2016-08-06 DIAGNOSIS — I69354 Hemiplegia and hemiparesis following cerebral infarction affecting left non-dominant side: Secondary | ICD-10-CM | POA: Insufficient documentation

## 2016-08-06 DIAGNOSIS — E785 Hyperlipidemia, unspecified: Secondary | ICD-10-CM | POA: Diagnosis not present

## 2016-08-06 DIAGNOSIS — I69359 Hemiplegia and hemiparesis following cerebral infarction affecting unspecified side: Secondary | ICD-10-CM | POA: Diagnosis not present

## 2016-08-06 DIAGNOSIS — M797 Fibromyalgia: Secondary | ICD-10-CM | POA: Diagnosis not present

## 2016-08-06 DIAGNOSIS — E669 Obesity, unspecified: Secondary | ICD-10-CM

## 2016-08-06 DIAGNOSIS — I10 Essential (primary) hypertension: Secondary | ICD-10-CM | POA: Insufficient documentation

## 2016-08-06 DIAGNOSIS — Z5189 Encounter for other specified aftercare: Secondary | ICD-10-CM | POA: Diagnosis present

## 2016-08-06 NOTE — Telephone Encounter (Signed)
They can use the old one.

## 2016-08-06 NOTE — Progress Notes (Signed)
Subjective:    Patient ID: Sharon Roberson, female    DOB: 05/06/1966, 50 y.o.   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   Last clinic visit 05/09/16.  Since then, she has completed therapies.  She saw Neurology and she was sent for OSA study, which was negative.  She has another point this month.  Pt is concerned about weight gain and is working with PCP.  She states she never received a call from the dietitian.  She has stopped taking the Gabapentin.  Overall, pt states she is doing okay, just upset about gaining weight, appears fixated.    Pain Inventory Average Pain 8 Pain Right Now 0 My pain is aching and ?  In the last 24 hours, has pain interfered with the following? General activity 2 Relation with others 2 Enjoyment of life 2 What TIME of day is your pain at its worst? daytime, evening Sleep (in general) Fair  Pain is worse with: some activites Pain improves with: heat/ice and medication Relief from Meds: 0  Mobility how many minutes can you walk? 30 ability to climb steps?  yes do you drive?  yes  Function disabled: date disabled 1999  Neuro/Psych bladder control problems spasms depression  Prior Studies Any changes since last visit?  no  Physicians involved in your care Any changes since last visit?  no Primary care n/a Neurologist n/a   Family History  Problem Relation Age of Onset  . Kidney cancer Mother     died 7679yr  (had kidney transplant)  . Lung cancer Mother   . Stroke Father     died 62y  Gallstones  . Hypertension Sister   . Thyroid disease Sister   . Bone cancer Brother     stem cell transplant  . Diabetes Brother   . Hypertension Brother   . Heart failure Sister   . Hypertension Sister    Social History   Social History  . Marital status: Significant Other    Spouse name: N/A  . Number of  children: 1  . Years of education: college   Occupational History  . Disability     Social History Main Topics  . Smoking status: Former Smoker    Packs/day: 0.50    Types: Cigarettes    Quit date: 04/23/2016  . Smokeless tobacco: Never Used  . Alcohol use Yes     Comment: occasional  . Drug use:     Types: Marijuana     Comment: marijuana: occasional  . Sexual activity: Not Asked   Other Topics Concern  . None   Social History Narrative   Right handed.   Caffeine some (soda every now and then)   Living home. One child (27 yr)   Disabled.     Past Surgical History:  Procedure Laterality Date  . KIDNEY STONE SURGERY     1999,2000,2001,2002,2003,2004  . LUNG SURGERY  2003  . THYROID SURGERY     Past Medical History:  Diagnosis Date  . Eyelid cyst, right 07/17/2016  . Hypercholesteremia   . Hypertension   . Pneumothorax   . Sarcoidosis ([facility])   . Stroke ([facility])    BP (!) 149/97   Pulse (!) 102   Wt 180 lb 6 oz (81.8 kg)   SpO2 96%   BMI 37.70 kg/m   Opioid Risk Score:   Fall Risk Score:  `  1  Depression screen PHQ 2/9  No flowsheet data found.  Review of Systems  Constitutional: Positive for diaphoresis and unexpected weight change.  Eyes: Negative.   Respiratory: Positive for cough.   Cardiovascular: Negative.   Gastrointestinal: Positive for constipation.  Endocrine:       High blood sugar  Genitourinary: Negative.   Musculoskeletal: Positive for joint swelling.  Allergic/Immunologic: Negative.   Neurological:       Spasms  Hematological: Negative.   Psychiatric/Behavioral: Positive for dysphoric mood.  All other systems reviewed and are negative.      Objective:   Physical Exam HEENT: normocephalic, atraumatic Cardio: RRR and no murmur Resp: CTA B/L and unlabored GI: BS positive and NT/ND Skin:  Intact. Warm and dry. Neuro: Drowsy. Oriented DTRs brisk throughout Motor:  RUE: 4+-5/5 proximal to distal RLE: 4+-5/5 proximal to  distal LUE/LLE: 5/5 throughout  No increase in tone noted Musc/Skel:  No edema. No tenderness Gen NAD. Vital signs reviewed. Psych: Normal mood. Normal affect.    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   1. 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 Pain Management:   Pt no longer required Gabapentin  Tramadol being prescribed by PCP  3. Hypertension  Slightly elevated this visit, pt states she recently took her meds  4. Obesity  Dietitian referral (encouraged pt follow up)   Encouraged activity and weight loss.  Discouraged use of "diet pill" again  Pt on Cymbalta, but does not believe this is contributing  5. Fibromyalgia  Being managed by PCP  Pt appears stable

## 2016-08-06 NOTE — Telephone Encounter (Signed)
Patient was just in office and asked about referral to dietician.  I see an old one in the system, a new referral will need to be made for this.  Spoke with patient and told her we would take care of this for her and they would contact her.

## 2016-08-06 NOTE — Telephone Encounter (Signed)
Pt had one placed less than 6 months ago. If she needs another order we may place one.  Thanks.

## 2016-08-08 ENCOUNTER — Ambulatory Visit: Payer: Medicare Other | Admitting: Physical Medicine & Rehabilitation

## 2016-09-04 ENCOUNTER — Encounter: Payer: Self-pay | Admitting: Neurology

## 2016-09-04 ENCOUNTER — Ambulatory Visit (INDEPENDENT_AMBULATORY_CARE_PROVIDER_SITE_OTHER): Payer: Medicare Other | Admitting: Neurology

## 2016-09-04 VITALS — BP 142/88 | HR 86 | Resp 16 | Ht <= 58 in | Wt 183.0 lb

## 2016-09-04 DIAGNOSIS — R531 Weakness: Secondary | ICD-10-CM

## 2016-09-04 DIAGNOSIS — R0683 Snoring: Secondary | ICD-10-CM | POA: Diagnosis not present

## 2016-09-04 DIAGNOSIS — G4761 Periodic limb movement disorder: Secondary | ICD-10-CM | POA: Diagnosis not present

## 2016-09-04 DIAGNOSIS — G479 Sleep disorder, unspecified: Secondary | ICD-10-CM | POA: Diagnosis not present

## 2016-09-04 NOTE — Patient Instructions (Signed)
Your sleep study did not show any significant obstructive sleep apnea.  You had mild leg twitching in sleep, which did not disturb your sleep very much and you current don't have significant restless legs symptoms.  You can be monitored for restless legs symptoms. Keep in mind restless legs syndrome (RLS) is associated with anemia and iron deficiency.  Please remember to try to maintain good sleep hygiene, which means: Keep a regular sleep and wake schedule, try not to exercise or have a meal within 2 hours of your bedtime, try to keep your bedroom conducive for sleep, that is, cool and dark, without light distractors such as an illuminated alarm clock, and refrain from watching TV right before sleep or in the middle of the night and do not keep the TV or radio on during the night. Also, try not to use or play on electronic devices at bedtime, such as your cell phone, tablet PC or laptop. If you like to read at bedtime on an electronic device, try to dim the background light as much as possible. Do not eat in the middle of the night.   I will see you back as needed. Keep your follow up with Dr. Roda ShuttersXu and or Darrol Angelarolyn Martin.   As discussed, work on weight loss. You may be at risk for sleep apnea in the future.

## 2016-09-04 NOTE — Progress Notes (Signed)
Subjective:    Patient ID: Sharon Roberson is a 50 y.o. female.  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 The patient is accompanied by her BF again today. I first met her on 06/25/2016, at which time she reported snoring and excessive daytime somnolence as well as morning headaches and trouble falling asleep and staying asleep. I invited her for sleep study. She had a baseline sleep study on 07/21/2016. I went over her test results with her in detail today. Her sleep latency was 47.5 minutes, sleep efficiency was 83.2%, REM latency was 124 minutes. She had an increased percentage of light stage sleep, stage I at 9.7% in stage II sleep 70%. She had slow-wave sleep at 9.8% and REM sleep was reduced at 10.5%. Her overall AHI was 4.6 per hour, borderline. Her REM AHI was 0 per hour, supine AHI was 28.8 per hour. Her PLM index was mildly elevated at 24.7 per hour, PLM arousal index was only 4.3 per hour. Average oxygen saturation was 98%, nadir was 91%.  Today, 09/04/2016: She reports no new symptoms, feels that her sleep study was fairly reflective of her typical sleep. She does not endorse any overt restless leg symptoms. Her boyfriend endorses that she sometimes moves in her sleep and kicks her legs. She is motivated to lose weight. Likes peanut butter cookies and drinks Kool-Aid, sometimes late-night snacks per boyfriend.  Previously:  06/24/2016: She reports snoring and excessive daytime somnolence, as well as morning headaches. She also has trouble falling asleep and staying asleep. Her Epworth sleepiness score is 9 out of 24 today, her fatigue score is 38 out of 63. Bedtime is around 10 PM, she watches TV in bed  for at least an hour, TV can stay on all night but sometimes she turns it off when she wakes up in the middle of the night. She reports a family history of OSA in her brother. She denies restless leg symptoms but boyfriend reports that she twitches and moves her legs in bed and while asleep. She quit smoking in July 2017. She drinks alcohol occasionally, maybe once a month, she smokes marijuana frequently, as much as she can afford it she states. This averages a few times a week. She does not typically drink caffeine on a daily basis. I reviewed your office note from 06/19/2016.   Her Past Medical History Is Significant For: Past Medical History:  Diagnosis Date  . Eyelid cyst, right 07/17/2016  . Hypercholesteremia   . Hypertension   . Pneumothorax   . Sarcoidosis ([facility])   . Stroke [facility])     Her Past Surgical History Is Significant For: Past Surgical History:  Procedure Laterality Date  . KIDNEY STONE SURGERY     1999,2000,2001,2002,2003,2004  . LUNG SURGERY  2003  . THYROID SURGERY      Her Family History Is Significant For: Family History  Problem Relation Age of Onset  . Kidney cancer Mother     died 58yr (had kidney transplant)  . Lung cancer Mother   . Stroke Father     died 62y  Gallstones  . Hypertension Sister   . Thyroid disease Sister   . Bone cancer Brother     stem cell transplant  .  Diabetes Brother   . Hypertension Brother   . Heart failure Sister   . Hypertension Sister     Her Social History Is Significant For: Social History   Social History  . Marital status: Significant Other    Spouse name: N/A  . Number of children: 1  . Years of education: college   Occupational History  . Disability     Social History Main Topics  . Smoking status: Former Smoker    Packs/day: 0.50    Types: Cigarettes    Quit date: 04/23/2016  . Smokeless tobacco: Never Used  . Alcohol use Yes     Comment: occasional  . Drug use:     Types: Marijuana     Comment:  marijuana: occasional  . Sexual activity: Not Asked   Other Topics Concern  . None   Social History Narrative   Right handed.   Caffeine some (soda every now and then)   Living home. One child (50 yr)   Disabled.      Her Allergies Are:  Allergies  Allergen Reactions  . Penicillins Anaphylaxis    Has patient had a PCN reaction causing immediate rash, facial/tongue/throat swelling, SOB or lightheadedness with hypotension: Yes Has patient had a PCN reaction causing severe rash involving mucus membranes or skin necrosis: No Has patient had a PCN reaction that required hospitalization Yes Has patient had a PCN reaction occurring within the last 10 years: Yes If all of the above answers are "NO", then may proceed with Cephalosporin use.   . Sulfamethoxazole Anaphylaxis  . Trazodone And Nefazodone Other (See Comments)    Hypotension, diaphoretic  :   Her Current Medications Are:  Outpatient Encounter Prescriptions as of 09/04/2016  Medication Sig  . aspirin 325 MG tablet Take 1 tablet (325 mg total) by mouth daily.  . Doxepin HCl (SILENOR) 3 MG TABS Take 3 mg by mouth daily.  . DULoxetine (CYMBALTA) 60 MG capsule Take 60 mg by mouth daily.  . hydrochlorothiazide (HYDRODIURIL) 12.5 MG tablet Take 1 tablet (12.5 mg total) by mouth every morning.  . pantoprazole (PROTONIX) 20 MG tablet Take 20 mg by mouth daily.  . simvastatin (ZOCOR) 80 MG tablet Take 1 tablet (80 mg total) by mouth at bedtime.  . [DISCONTINUED] famotidine (PEPCID) 40 MG tablet Take 1 tablet (40 mg total) by mouth daily.  . [DISCONTINUED] methocarbamol (ROBAXIN) 500 MG tablet Take 2 tablets (1,000 mg total) by mouth every 8 (eight) hours as needed for muscle spasms.  . [DISCONTINUED] traMADol (ULTRAM) 50 MG tablet Take 1 tablet (50 mg total) by mouth 4 (four) times daily as needed for moderate pain.   No facility-administered encounter medications on file as of 09/04/2016.   :  Review of Systems:  Out of a  complete 14 point review of systems, all are reviewed and negative with the exception of these symptoms as listed below:  Review of Systems  Neurological:       Patient is here to discuss the results of her sleep study     Objective:  Neurologic Exam  Physical Exam Physical Examination:   Vitals:   09/04/16 0911  BP: (!) 142/88  Pulse: 86  Resp: 16   General Examination: The patient is a very pleasant 50 y.o. female in no acute distress. She appears well-developed and well-nourished and well groomed.   HEENT: Normocephalic, atraumatic, pupils are equal, round and reactive to light and accommodation. Extraocular tracking is good without limitation to gaze excursion  or nystagmus noted. Normal smooth pursuit is noted. Hearing is grossly intact. Face is symmetric with normal facial animation and normal facial sensation. Speech is clear with no dysarthria noted. There is no hypophonia. There is no lip, neck/head, jaw or voice tremor. Neck is supple with full range of passive and active motion. There are no carotid bruits on auscultation. Oropharynx exam reveals: mild mouth dryness, adequate dental hygiene and moderate airway crowding, due to larger tongue and longer uvula, tonsils in place, about 1+. Mallampati is class II. Tongue protrudes centrally and palate elevates symmetrically.   Chest: Clear to auscultation without wheezing, rhonchi or crackles noted.  Heart: S1+S2+0, regular and normal without murmurs, rubs or gallops noted.   Abdomen: Soft, non-tender and non-distended with normal bowel sounds appreciated on auscultation.  Extremities: There is no pitting edema in the distal lower extremities bilaterally. Pedal pulses are intact.  Skin: Warm and dry without trophic changes noted. There are no varicose veins.  Musculoskeletal: exam reveals no obvious joint deformities, tenderness or joint swelling or erythema.   Neurologically:  Mental status: The patient is awake, alert and  oriented in all 4 spheres. Her immediate and remote memory, attention, language skills and fund of knowledge are appropriate. There is no evidence of aphasia, agnosia, apraxia or anomia. Speech is clear with normal prosody and enunciation. Thought process is linear. Mood is normal and affect is normal.  Cranial nerves II - XII are as described above under HEENT exam. In addition: shoulder shrug is normal with equal shoulder height noted. Motor exam: Normal bulk, strength and tone is noted with the exception of very slight right upper and right lower extremity weakness, stable. There is no drift, tremor or rebound. Romberg is negative. Reflexes are 2+ on the L and slightly brisker on the R. Fine motor skills and coordination: intact in the UEs and LEs.  Cerebellar testing: No dysmetria or intention tremor on finger to nose testing. Heel to shin is unremarkable bilaterally. There is no truncal or gait ataxia.  Sensory exam: intact to light touch in the upper and lower extremities.  Gait, station and balance: She stands easily. No veering to one side is noted. No leaning to one side is noted. Posture is age-appropriate and stance is narrow based. Gait shows normal stride length and normal pace. No problems turning are noted.               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 Her overall AHI was less than 5 per hour, she has evidence of supine sleep apnea, for which I encouraged her to try to stay off her back for sleep and to try to lose weight. She has mild PLMS during the sleep study but does not endorse currently any significant restless leg symptoms and therefore I would not push for any additional  medications for PLMS only. We talked about maintaining good sleep hygiene and healthy lifestyle in general. She is encouraged to work on weight loss and reduce her calorie intake, reduce her suite and drink intake and increase her water intake. From the sleep standpoint, I can see her back on an as-needed basis. She is encouraged to keep her appointment with Sharon Roberson and/or Dr. Erlinda Hong as scheduled.  She also sees Dr. Posey Pronto in rehabilitation medicine.  I answered all their questions today and the patient and her BF were in agreement. I spent 25 minutes in total face-to-face time with the patient, more than 50% of which was spent in counseling and coordination of care, reviewing test results, reviewing medication and discussing or reviewing the diagnosis of sleep disturbance, supine sleep apnea, PLMD, the prognosis and treatment options.

## 2016-09-18 ENCOUNTER — Ambulatory Visit (INDEPENDENT_AMBULATORY_CARE_PROVIDER_SITE_OTHER): Payer: Medicare Other | Admitting: Nurse Practitioner

## 2016-09-18 ENCOUNTER — Encounter: Payer: Self-pay | Admitting: Nurse Practitioner

## 2016-09-18 VITALS — BP 145/95 | HR 84 | Ht <= 58 in | Wt 184.8 lb

## 2016-09-18 DIAGNOSIS — I639 Cerebral infarction, unspecified: Secondary | ICD-10-CM

## 2016-09-18 DIAGNOSIS — I6350 Cerebral infarction due to unspecified occlusion or stenosis of unspecified cerebral artery: Secondary | ICD-10-CM

## 2016-09-18 DIAGNOSIS — E785 Hyperlipidemia, unspecified: Secondary | ICD-10-CM | POA: Diagnosis not present

## 2016-09-18 DIAGNOSIS — I10 Essential (primary) hypertension: Secondary | ICD-10-CM | POA: Diagnosis not present

## 2016-09-18 NOTE — Patient Instructions (Signed)
Stressed the importance of management of risk factors to prevent further stroke Continue Aspirin for secondary stroke prevention Maintain strict control of hypertension with blood pressure goal below 130/90,Continue antihypertensive medications todays reading 145/95 Control of diabetes with hemoglobin A1c below 6.5 followed by primary care most recent hemoglobin A1c 5.6  Cholesterol with LDL cholesterol less than 70, followed by primary care, Continue Zocor  After therapy Exercise by walking, water aerobics, YOGA eat healthy diet with whole grains,  fresh fruits and vegetables no fast food , junk food Follow up  In 6 months

## 2016-09-18 NOTE — Progress Notes (Signed)
[facility]  PATIENT: Sharon Roberson DOB: 02/25/1966   REASON FOR VISIT:  follow-up for left pontine infarct HISTORY FROM: Patient    HISTORY OF PRESENT ILLNESS:UPDATE 12/14/2017CM Sharon Roberson, 50 year old female returns for follow-up. 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 She is currently on aspirin for secondary stroke prevention without further stroke or TIA symptoms. In addition she is on Zocor 80 mg at bedtime. She denies any myalgias. Blood pressure medication was recently changed to Losartan/ HCTZ  and is 145/95 the office today. Her rehabilitation has concluded her physical therapy. 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 The cardiac event monitor was negative for any irregular heartbeats. She has not had any neurologic complaints. She returns for reevaluation  HISTORY 9/14/17CMInez Janice Roberson is a 50 y.o. female with right sided weakness that was present on awakening 04/23/16.  Her daughter noticed that her speech was slurred a little bit later and therefore EMS was called. 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 She is not sure of any symptoms of neurosarcoidosis that she has other than headaches. MRI  left pontine infarct.  left cingulate gyrus linear DWI changes suspicious for infarct but atypical. MRA unremarkable  Carotid Doppler unremarkable 2D Echo  EF 60-65 Due to atypical linear DWI changes at left cingulate gyrus, will recommend 30 day cardiac event monitoring as outpt. Please arrange on discharge. LDL 147  HgbA1c 5.6 she returns to the clinic today for follow-up visit.  She denies further stroke or TIA symptoms. She is currently on aspirin for secondary stroke prevention. She is on Zocor 80 mg daily without complaints of myalgias. She had a fall several weeks ago going up steps, she continues to have mild lower extremity weakness. She has not had cardiac monitoring 30 day event arranged as yet.  She did quit smoking and was congratulated on this. She also complains today of morning headache daytime drowsiness and her partner says she snores at night. She has never had a sleep study. She returns for reevaluation   REVIEW OF SYSTEMS: Full 14 system review of systems performed and notable only for those listed, all others are neg:  Constitutional: Sweating Cardiovascular: neg Ear/Nose/Throat: neg  Skin: neg Eyes: neg Respiratory: Cough Gastroitestinal: neg  Hematology/Lymphatic: neg  Endocrine: neg Musculoskeletal: Joint pain Allergy/Immunology: neg Neurological: Headache,  Psychiatric: Depression Sleep : Daytime sleepiness   ALLERGIES: Allergies  Allergen Reactions  . Penicillins Anaphylaxis    Has patient had a PCN reaction causing immediate rash, facial/tongue/throat swelling, SOB or lightheadedness with hypotension: Yes Has patient had a PCN reaction causing severe rash involving mucus membranes or skin necrosis: No Has patient had a PCN reaction that required hospitalization Yes Has patient had a PCN reaction occurring within the last 10 years: Yes If all of the above answers are "NO", then may proceed with Cephalosporin use.   . Sulfamethoxazole Anaphylaxis  . Trazodone And Nefazodone Other (See Comments)    Hypotension, diaphoretic    HOME MEDICATIONS: Outpatient Medications Prior to Visit  Medication Sig Dispense Refill  . aspirin 325 MG tablet Take 1 tablet (325 mg  total) by mouth daily. 30 tablet 0  . Doxepin HCl (SILENOR) 3 MG TABS Take 3 mg by mouth daily.    . DULoxetine (CYMBALTA) 60 MG capsule Take 60 mg by mouth daily.    .  pantoprazole (PROTONIX) 20 MG tablet Take 20 mg by mouth daily.    . simvastatin (ZOCOR) 80 MG tablet Take 1 tablet (80 mg total) by mouth at bedtime. 30 tablet 0  . hydrochlorothiazide (HYDRODIURIL) 12.5 MG tablet Take 1 tablet (12.5 mg total) by mouth every morning. 30 tablet 0   No facility-administered medications prior to visit.     PAST MEDICAL HISTORY: Past Medical History:  Diagnosis Date  . Eyelid cyst, right 07/17/2016  . Hypercholesteremia   . Hypertension   . Pneumothorax   . Sarcoidosis (HCC)   . Stroke [facility])     PAST SURGICAL HISTORY: Past Surgical History:  Procedure Laterality Date  . KIDNEY STONE SURGERY     1999,2000,2001,2002,2003,2004  . LUNG SURGERY  2003  . THYROID SURGERY      FAMILY HISTORY: Family History  Problem Relation Age of Onset  . Kidney cancer Mother     died 67yr  (had kidney transplant)  . Lung cancer Mother   . Stroke Father     died 62y  Gallstones  . Hypertension Sister   . Thyroid disease Sister   . Bone cancer Brother     stem cell transplant  . Diabetes Brother   . Hypertension Brother   . Heart failure Sister   . Hypertension Sister     SOCIAL HISTORY: Social History   Social History  . Marital status: Significant Other    Spouse name: N/A  . Number of children: 1  . Years of education: college   Occupational History  . Disability     Social History Main Topics  . Smoking status: Former Smoker    Packs/day: 0.50    Types: Cigarettes    Quit date: 04/23/2016  . Smokeless tobacco: Never Used  . Alcohol use Yes     Comment: occasional  . Drug use:     Types: Marijuana     Comment: marijuana: occasional  . Sexual activity: Not on file   Other Topics Concern  . Not on file   Social History Narrative   Right handed.   Caffeine some (soda every now and then)   Living home. One child (27 yr)   Disabled.       PHYSICAL EXAM  Vitals:   09/18/16 1023  BP: (!) 145/95  Pulse: 84  Weight: 184 lb 12.8 oz  (83.8 kg)  Height: 4\' 10"  (1.473 m)   Body mass index is 38.62 kg/m.  Generalized: Well developed, Obese female in no acute distress  Head: normocephalic and atraumatic,. Oropharynx benign  Neck: Supple, no carotid bruits  Cardiac: Regular rate rhythm, no murmur  Musculoskeletal: No deformity   Neurological examination   Mentation: Alert oriented to time, place, history taking. Attention span and concentration appropriate. Recent and remote memory intact.  Follows all commands speech and language fluent.   Cranial nerve II-XII: Pupils were equal round reactive to light extraocular movements were full, visual field were full on confrontational test. Facial sensation and strength were normal. hearing was intact to finger rubbing bilaterally. Uvula tongue midline. head turning and shoulder shrug were normal and symmetric.Tongue protrusion into cheek strength was normal. Motor: normal bulk and tone, full strength in the BUE, BLE Sensory: normal and symmetric to  light touch, pinprick, and  Vibration, in the upper and lower extremities Coordination: finger-nose-finger, heel-to-shin bilaterally, no dysmetria Reflexes: Brachioradialis 2/2, biceps 2/2, triceps 2/2, patellar 2/2, Achilles 2/2, plantar responses were flexor bilaterally. Gait and Station: Rising up from seated position without assistance, normal stance,  moderate stride, good arm swing, smooth turning, able to perform tiptoe, and heel walking without difficulty. Tandem gait is steady  DIAGNOSTIC DATA (LABS, IMAGING, TESTING) - I reviewed patient records, labs, notes, testing and imaging myself where available.  Lab Results  Component Value Date   WBC 6.0 04/28/2016   HGB 14.7 04/28/2016   HCT 43.1 04/28/2016   MCV 91.3 04/28/2016   PLT 232 04/28/2016      Component Value Date/Time   NA 138 04/28/2016 0706   K 3.6 04/28/2016 0706   CL 103 04/28/2016 0706   CO2 27 04/28/2016 0706   GLUCOSE 103 (H) 04/28/2016 0706   BUN 10  04/28/2016 0706   CREATININE 0.73 04/28/2016 0706   CALCIUM 9.6 04/28/2016 0706   PROT 8.3 (H) 04/28/2016 0706   ALBUMIN 4.0 04/28/2016 0706   AST 24 04/28/2016 0706   ALT 17 04/28/2016 0706   ALKPHOS 88 04/28/2016 0706   BILITOT 0.6 04/28/2016 0706   GFRNONAA >60 04/28/2016 0706   GFRAA >60 04/28/2016 0706   Lab Results  Component Value Date   CHOL 251 (H) 04/24/2016   HDL 54 04/24/2016   LDLCALC 147 (H) 04/24/2016   TRIG 250 (H) 04/24/2016   CHOLHDL 4.6 04/24/2016   Lab Results  Component Value Date   HGBA1C 5.6 04/24/2016    ASSESSMENT AND PLAN  50 y.o. year old female  has a past medical history of Eyelid cyst, right (07/17/2016); Hypercholesteremia; Hypertension; Pneumothorax; Sarcoidosis (HCC); and Stroke (HCC). here To follow-up for left pontine infarct     PLAN: Stressed the importance of management of risk factors to prevent further stroke Continue Aspirin for secondary stroke prevention Maintain strict control of hypertension with blood pressure goal below 130/90,Continue antihypertensive medications todays reading 145/95 Control of diabetes with hemoglobin A1c below 6.5 followed by primary care most recent hemoglobin A1c 5.6  Cholesterol with LDL cholesterol less than 70, followed by primary care, Continue Zocor  Exercise by walking, water aerobics, YOGA eat healthy diet with whole grains,  fresh fruits and vegetables no fast food , junk food Reviewed the cardiac event monitoring the patient Reviewed sleep study results with patient and importance of sleep hygiene Follow up  In 6 months if stable at that time we will discharge Greater than 50% of time during this 25 minute visit was spent on counseling,explanation and education of diagnosis, planning of further management, discussion with patient and coordination of care Nilda RiggsNancy Carolyn Martin, [facility] HospitalGNP, [facility] ParBC, APRN  [facility] [address], [address] [place], [place] [address] [phone]

## 2016-09-19 NOTE — Progress Notes (Signed)
I reviewed above note and agree with the assessment and plan.  Marvel PlanJindong Anacleto Batterman, MD PhD Stroke Neurology 09/19/2016 3:08 PM

## 2016-11-06 ENCOUNTER — Encounter: Payer: Self-pay | Admitting: Physical Medicine & Rehabilitation

## 2016-11-06 ENCOUNTER — Encounter: Payer: Medicare Other | Attending: Physical Medicine & Rehabilitation | Admitting: Physical Medicine & Rehabilitation

## 2016-11-06 VITALS — BP 137/93 | HR 83

## 2016-11-06 DIAGNOSIS — M7062 Trochanteric bursitis, left hip: Secondary | ICD-10-CM

## 2016-11-06 DIAGNOSIS — I69359 Hemiplegia and hemiparesis following cerebral infarction affecting unspecified side: Secondary | ICD-10-CM | POA: Diagnosis not present

## 2016-11-06 DIAGNOSIS — E785 Hyperlipidemia, unspecified: Secondary | ICD-10-CM | POA: Diagnosis not present

## 2016-11-06 DIAGNOSIS — Z5189 Encounter for other specified aftercare: Secondary | ICD-10-CM | POA: Diagnosis present

## 2016-11-06 DIAGNOSIS — I10 Essential (primary) hypertension: Secondary | ICD-10-CM

## 2016-11-06 DIAGNOSIS — E669 Obesity, unspecified: Secondary | ICD-10-CM | POA: Diagnosis not present

## 2016-11-06 DIAGNOSIS — G894 Chronic pain syndrome: Secondary | ICD-10-CM | POA: Diagnosis not present

## 2016-11-06 DIAGNOSIS — I69354 Hemiplegia and hemiparesis following cerebral infarction affecting left non-dominant side: Secondary | ICD-10-CM | POA: Diagnosis not present

## 2016-11-06 DIAGNOSIS — M797 Fibromyalgia: Secondary | ICD-10-CM

## 2016-11-06 NOTE — Progress Notes (Signed)
Subjective:    Patient ID: Sharon Roberson, female    DOB: 1966/04/13, 51 y.o.   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   Last clinic visit 08/06/16.  She does HEP at times.  She saw Neurology.  She was started on medication for OA by PCP.  She is scheduled to have bone density test.  She is no longer taking tramadol.  Her BP remains slightly elevated, it was recently adjusted by PCP. She states she never received a call from her dietitian.  She is no longer Cymbalta, she states she stopped.  Pt main complaint today is left hip pain.  Present for months.  It limits ambulation. No associated numbness.   Pain Inventory Average Pain 5 Pain Right Now 3 My pain is sharp and aching  In the last 24 hours, has pain interfered with the following? General activity 6 Relation with others 2 Enjoyment of life 6 What TIME of day is your pain at its worst? daytime, evening Sleep (in general) Poor  Pain is worse with: walking, sitting and some activites Pain improves with: heat/ice and medication Relief from Meds: 4  Mobility walk without assistance how many minutes can you walk? 30 ability to climb steps?  no do you drive?  no  Function disabled: date disabled 1999 I need assistance with the following:  toileting and household duties  Neuro/Psych bladder control problems spasms depression  Prior Studies Any changes since last visit?  no  Physicians involved in your care Any changes since last visit?  no Primary care n/a Neurologist n/a   Family History  Problem Relation Age of Onset  . Kidney cancer Mother     died 12yr  (had kidney transplant)  . Lung cancer Mother   . Stroke Father     died 62y  Gallstones  . Hypertension Sister   . Thyroid disease Sister   . Bone cancer Brother     stem cell transplant  . Diabetes Brother   .  Hypertension Brother   . Heart failure Sister   . Hypertension Sister    Social History   Social History  . Marital status: Significant Other    Spouse name: N/A  . Number of children: 1  . Years of education: college   Occupational History  . Disability     Social History Main Topics  . Smoking status: Former Smoker    Packs/day: 0.50    Types: Cigarettes    Quit date: 04/23/2016  . Smokeless tobacco: Never Used  . Alcohol use Yes     Comment: occasional  . Drug use: Yes    Types: Marijuana     Comment: marijuana: occasional  . Sexual activity: Not Asked   Other Topics Concern  . None   Social History Narrative   Right handed.   Caffeine some (soda every now and then)   Living home. One child (27 yr)   Disabled.     Past Surgical History:  Procedure Laterality Date  . KIDNEY STONE SURGERY     1999,2000,2001,2002,2003,2004  . LUNG SURGERY  2003  . THYROID SURGERY     Past Medical History:  Diagnosis Date  . Eyelid cyst, right 07/17/2016  . Hypercholesteremia   . Hypertension   . Pneumothorax   . Sarcoidosis (HCC)   . Stroke (HCC)    BP (!) 137/93  Pulse 83   SpO2 97%   Opioid Risk Score:   Fall Risk Score:  `1  Depression screen PHQ 2/9  No flowsheet data found.  Review of Systems  Constitutional: Positive for diaphoresis and unexpected weight change.  Eyes: Negative.   Respiratory: Positive for cough.   Cardiovascular: Negative.   Gastrointestinal: Positive for constipation.  Endocrine:       High blood sugar  Genitourinary: Negative.   Musculoskeletal: Positive for joint swelling.  Allergic/Immunologic: Negative.   Neurological: Positive for weakness.       Spasms  Hematological: Negative.   Psychiatric/Behavioral: Positive for dysphoric mood.  All other systems reviewed and are negative.     Objective:   Physical Exam HEENT: normocephalic, atraumatic Cardio: RRR. No JVD. Resp: CTA B/L and unlabored GI: BS positive and  NT/ND Skin:  Intact. Warm and dry. Neuro: Alert and Oriented DTRs brisk throughout Motor:  RUE: 4+-5/5 proximal to distal (unchanged) RLE: 4+-5/5 proximal to distal (unchanged) LUE/LLE: 5/5 throughout  No increase in tone noted Musc/Skel:  No edema. TTP left trochanteric bursa. Gen NAD. Vital signs reviewed. Psych: Normal mood. Normal affect.    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   1. 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 Chronic Pain syndrome:   Pt no longer required Gabapentin  Labs reviewed, Mobic increased to 15mg  daily  3. Hypertension  Slightly elevated this visit, pt states she recently had her meds adjusted  4. Obesity  Dietitian referral (encouraged pt follow up, again)   Encouraged activity and weight loss.  Discouraged use of "diet pill" again  5. Fibromyalgia  Encouraged pt to resume Cymbalta  Being managed by PCP  Pt appears stable  6. Left greater trochanteric bursa syndrome  Will schedule for steroid injection

## 2016-11-14 ENCOUNTER — Encounter: Payer: Self-pay | Admitting: Physical Medicine & Rehabilitation

## 2016-11-14 ENCOUNTER — Encounter (HOSPITAL_BASED_OUTPATIENT_CLINIC_OR_DEPARTMENT_OTHER): Payer: Medicare Other | Admitting: Physical Medicine & Rehabilitation

## 2016-11-14 VITALS — BP 150/91 | HR 74

## 2016-11-14 DIAGNOSIS — I69354 Hemiplegia and hemiparesis following cerebral infarction affecting left non-dominant side: Secondary | ICD-10-CM | POA: Diagnosis not present

## 2016-11-14 DIAGNOSIS — M7062 Trochanteric bursitis, left hip: Secondary | ICD-10-CM | POA: Diagnosis not present

## 2016-11-14 NOTE — Progress Notes (Signed)
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  Informed consent was obtained after describing risks and benefits of the procedure with the patient, this includes bleeding, bruising, infection and medication side effects. The patient wishes to proceed and has given written consent. Patient was placed in a seated position. The left greater trochanteric area was marked and prepped with betadine at the point of maximal tenderness. A 22-gauge 2 inch needle was inserted into the greater trochanteric area until bone was felt.  Needle was drawn back and after negative draw back for blood, a 2cc solution containing 0.5 mL of 6 mg per ML celestone and 1.5 mL of 2% lidocaine was injected. A band aid was applied. The patient tolerated the procedure well. Post procedure instructions were given.

## 2016-12-12 ENCOUNTER — Encounter: Payer: Self-pay | Admitting: Physical Medicine & Rehabilitation

## 2016-12-12 ENCOUNTER — Encounter: Payer: Medicare Other | Attending: Physical Medicine & Rehabilitation | Admitting: Physical Medicine & Rehabilitation

## 2016-12-12 VITALS — BP 150/103 | HR 88

## 2016-12-12 DIAGNOSIS — Z5189 Encounter for other specified aftercare: Secondary | ICD-10-CM | POA: Diagnosis present

## 2016-12-12 DIAGNOSIS — M797 Fibromyalgia: Secondary | ICD-10-CM | POA: Diagnosis not present

## 2016-12-12 DIAGNOSIS — I10 Essential (primary) hypertension: Secondary | ICD-10-CM | POA: Insufficient documentation

## 2016-12-12 DIAGNOSIS — M7062 Trochanteric bursitis, left hip: Secondary | ICD-10-CM | POA: Diagnosis not present

## 2016-12-12 DIAGNOSIS — E669 Obesity, unspecified: Secondary | ICD-10-CM | POA: Diagnosis not present

## 2016-12-12 DIAGNOSIS — I69354 Hemiplegia and hemiparesis following cerebral infarction affecting left non-dominant side: Secondary | ICD-10-CM | POA: Diagnosis not present

## 2016-12-12 DIAGNOSIS — G894 Chronic pain syndrome: Secondary | ICD-10-CM | POA: Diagnosis not present

## 2016-12-12 DIAGNOSIS — I69359 Hemiplegia and hemiparesis following cerebral infarction affecting unspecified side: Secondary | ICD-10-CM | POA: Diagnosis not present

## 2016-12-12 DIAGNOSIS — E785 Hyperlipidemia, unspecified: Secondary | ICD-10-CM | POA: Diagnosis not present

## 2016-12-12 MED ORDER — DULOXETINE HCL 30 MG PO CPEP: 30.0000 mg | ORAL_CAPSULE | Freq: Every day | ORAL | 1 refills | Status: DC

## 2016-12-12 MED ORDER — MELOXICAM 15 MG PO TABS: 15.0000 mg | ORAL_TABLET | Freq: Every day | ORAL | 1 refills | Status: DC

## 2016-12-12 MED ORDER — GABAPENTIN 100 MG PO CAPS: 100.0000 mg | ORAL_CAPSULE | Freq: Three times a day (TID) | ORAL | 1 refills | Status: DC

## 2016-12-12 NOTE — Progress Notes (Signed)
Subjective:    Patient ID: Sharon Roberson, female    DOB: Jan 19, 1966, 51 y.o.   MRN: [id number]  HPI  51 year old right-handed female, history of hypertension, hyperlipidemia, tobacco abuse presents for follow up for left pontine and left cingulate gyrus infarct.   Last clinic visit 11/14/16.  Poor historian. At that time, her Meloxicam was increased, and she states that is helping.  Her BP remains elevated, she has not seen her PCP.  She states she never received a call from the dietitian.  She restarted Cymbalta with ?benefit.  She had a left greater trochanteric bursa injection on 2/9, but states that did not help her.    Pain Inventory Average Pain 8 Pain Right Now 8 My pain is sharp and stabbing  In the last 24 hours, has pain interfered with the following? General activity 7 Relation with others 7 Enjoyment of life 7 What TIME of day is your pain at its worst? morning Sleep (in general) Poor  Pain is worse with: walking, sitting and some activites Pain improves with: heat/ice Relief from Meds: 4  Mobility how many minutes can you walk? 5 ability to climb steps?  no do you drive?  no  Function disabled: date disabled 1999 Do you have any goals in this area?  no  Neuro/Psych weakness tingling trouble walking depression  Prior Studies Any changes since last visit?  no  Physicians involved in your care Any changes since last visit?  no Primary care n/a Neurologist n/a   Family History  Problem Relation Age of Onset  . Kidney cancer Mother     died 85yr  (had kidney transplant)  . Lung cancer Mother   . Stroke Father     died 62y  Gallstones  . Hypertension Sister   . Thyroid disease Sister   . Bone cancer Brother     stem cell transplant  . Diabetes Brother   . Hypertension Brother   . Heart failure Sister   . Hypertension Sister    Social History   Social History  . Marital status: Significant Other    Spouse name: N/A  . Number of children: 1  .  Years of education: college   Occupational History  . Disability     Social History Main Topics  . Smoking status: Former Smoker    Packs/day: 0.50    Types: Cigarettes    Quit date: 04/23/2016  . Smokeless tobacco: Never Used  . Alcohol use Yes     Comment: occasional  . Drug use: Yes    Types: Marijuana     Comment: marijuana: occasional  . Sexual activity: Not Asked   Other Topics Concern  . None   Social History Narrative   Right handed.   Caffeine some (soda every now and then)   Living home. One child (27 yr)   Disabled.     Past Surgical History:  Procedure Laterality Date  . KIDNEY STONE SURGERY     1999,2000,2001,2002,2003,2004  . LUNG SURGERY  2003  . THYROID SURGERY     Past Medical History:  Diagnosis Date  . Eyelid cyst, right 07/17/2016  . Hypercholesteremia   . Hypertension   . Pneumothorax   . Sarcoidosis (HCC)   . Stroke (HCC)    BP (!) 150/103   Pulse 88   SpO2 99%   Opioid Risk Score:   Fall Risk Score:  `1  Depression screen PHQ 2/9  No flowsheet data found.  Review of Systems  Constitutional: Positive for diaphoresis and unexpected weight change.  HENT: Negative.   Eyes: Negative.   Respiratory: Positive for cough.   Cardiovascular: Negative.   Gastrointestinal: Positive for nausea. Negative for constipation.  Endocrine: Negative.        High blood sugar  Genitourinary: Negative.   Musculoskeletal: Positive for joint swelling.  Skin: Negative.   Allergic/Immunologic: Negative.   Neurological: Positive for weakness.       Spasms  Hematological: Negative.   Psychiatric/Behavioral: Positive for dysphoric mood.  All other systems reviewed and are negative.     Objective:   Physical Exam HEENT: normocephalic, atraumatic Cardio: RRR. No JVD. Resp: CTA B/L and unlabored GI: BS positive and NT/ND Skin:  Intact. Warm and dry. Neuro: Alert and Oriented DTRs brisk throughout Motor:  RUE: 5/5 proximal to distal RLE: 5/5  proximal to distal  LUE/LLE: 5/5 throughout  No increase in tone noted Musc/Skel:  No edema. TTP left trochanteric bursa. Gen NAD. Vital signs reviewed. Psych: Normal mood. Normal affect.    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   1. 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 Chronic Pain syndrome:   Will resume Gabapentin 100 TID  Mobic increased to 15mg  on last visit, but patient states she never increased the dose and continues to take 7.5  3. Hypertension  Remains elevated, encouraged pt to follow up with PCP  4. Obesity  Dietitian referral (encouraged pt follow up, 3rd time)   Encouraged activity and weight loss.  Discouraged use of "diet pill" again  5. Fibromyalgia  Encouraged pt to resume Cymbalta, now patient states she didn't have any and therefore could not take it  Being managed by PCP  Pt appears stable

## 2017-01-22 ENCOUNTER — Encounter: Payer: Medicare Other | Admitting: Physical Medicine & Rehabilitation

## 2017-01-29 ENCOUNTER — Encounter: Payer: Self-pay | Admitting: Physical Medicine & Rehabilitation

## 2017-01-29 ENCOUNTER — Encounter: Payer: Medicare Other | Attending: Physical Medicine & Rehabilitation | Admitting: Physical Medicine & Rehabilitation

## 2017-01-29 VITALS — BP 128/84 | HR 72 | Resp 14

## 2017-01-29 DIAGNOSIS — I6930 Unspecified sequelae of cerebral infarction: Secondary | ICD-10-CM | POA: Diagnosis not present

## 2017-01-29 DIAGNOSIS — M797 Fibromyalgia: Secondary | ICD-10-CM | POA: Insufficient documentation

## 2017-01-29 DIAGNOSIS — M7062 Trochanteric bursitis, left hip: Secondary | ICD-10-CM | POA: Diagnosis not present

## 2017-01-29 DIAGNOSIS — G894 Chronic pain syndrome: Secondary | ICD-10-CM

## 2017-01-29 DIAGNOSIS — I69354 Hemiplegia and hemiparesis following cerebral infarction affecting left non-dominant side: Secondary | ICD-10-CM | POA: Diagnosis not present

## 2017-01-29 DIAGNOSIS — E669 Obesity, unspecified: Secondary | ICD-10-CM | POA: Diagnosis not present

## 2017-01-29 DIAGNOSIS — Z5189 Encounter for other specified aftercare: Secondary | ICD-10-CM | POA: Diagnosis present

## 2017-01-29 DIAGNOSIS — I10 Essential (primary) hypertension: Secondary | ICD-10-CM | POA: Insufficient documentation

## 2017-01-29 DIAGNOSIS — E785 Hyperlipidemia, unspecified: Secondary | ICD-10-CM | POA: Diagnosis not present

## 2017-01-29 NOTE — Progress Notes (Addendum)
Subjective:    Patient ID: Sharon Roberson, female    DOB: 1966-06-30, 51 y.o.   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   Last clinic visit 12/12/16.  Poor historian. Since last visit, she continues to do HEP.  She has not seen Neurology yet, she is unsure if she needs to follow up.  She gets nauseated with Gabapentin.  She does not note improvement with Meloxicam.  Her BP has improved. She states she still has not received a phone call from the dietitian.  She is taking Cymbalta. Her greatest complaint is left hip pain.   Pain Inventory Average Pain 8 Pain Right Now 8 My pain is sharp and aching  In the last 24 hours, has pain interfered with the following? General activity 8 Relation with others 7 Enjoyment of life 8 What TIME of day is your pain at its worst? morning, daytime, night Sleep (in general) Fair  Pain is worse with: walking, sitting and some activites Pain improves with: heat/ice and medication Relief from Meds: 3  Mobility walk without assistance how many minutes can you walk? 2 ability to climb steps?  no do you drive?  no  Function disabled: date disabled 1999 Do you have any goals in this area?  no  Neuro/Psych weakness trouble walking spasms depression  Prior Studies Any changes since last visit?  no  Physicians involved in your care Any changes since last visit?  no   Family History  Problem Relation Age of Onset  . Kidney cancer Mother     died 86yr  (had kidney transplant)  . Lung cancer Mother   . Stroke Father     died 62y  Gallstones  . Hypertension Sister   . Thyroid disease Sister   . Bone cancer Brother     stem cell transplant  . Diabetes Brother   . Hypertension Brother   . Heart failure Sister   . Hypertension Sister    Social History   Social History  . Marital status: Significant  Other    Spouse name: N/A  . Number of children: 1  . Years of education: college   Occupational History  . Disability     Social History Main Topics  . Smoking status: Former Smoker    Packs/day: 0.50    Types: Cigarettes    Quit date: 04/23/2016  . Smokeless tobacco: Never Used  . Alcohol use Yes     Comment: occasional  . Drug use: Yes    Types: Marijuana     Comment: marijuana: occasional  . Sexual activity: Not Asked   Other Topics Concern  . None   Social History Narrative   Right handed.   Caffeine some (soda every now and then)   Living home. One child (27 yr)   Disabled.     Past Surgical History:  Procedure Laterality Date  . KIDNEY STONE SURGERY     1999,2000,2001,2002,2003,2004  . LUNG SURGERY  2003  . THYROID SURGERY     Past Medical History:  Diagnosis Date  . Eyelid cyst, right 07/17/2016  . Hypercholesteremia   . Hypertension   . Pneumothorax   . Sarcoidosis   . Stroke (HCC)    BP 128/84 (BP Location: Right Arm, Patient Position: Sitting, Cuff Size: Large)   Pulse 72   Resp 14   SpO2 97%   Opioid Risk  Score:   Fall Risk Score:  `1  Depression screen PHQ 2/9  No flowsheet data found.  Review of Systems  Constitutional: Positive for diaphoresis and unexpected weight change.  HENT: Negative.   Eyes: Negative.   Respiratory: Positive for cough.   Cardiovascular: Negative.   Gastrointestinal: Positive for nausea. Negative for constipation.  Endocrine: Negative.        High blood sugar  Genitourinary: Negative.   Musculoskeletal: Positive for joint swelling.       Spasms   Skin: Negative.   Allergic/Immunologic: Negative.   Neurological: Positive for weakness.  Hematological: Bruises/bleeds easily.  Psychiatric/Behavioral: Positive for dysphoric mood.  All other systems reviewed and are negative.     Objective:   Physical Exam Gen NAD. Vital signs reviewed. HEENT: normocephalic, atraumatic Cardio: RRR. No JVD. Resp: CTA B/L  and Unlabored GI: BS positive and NT Musc/Skel:  No edema. TTP left trochanteric bursa. Gait: Slightly antalgic Neuro: Alert and Oriented Motor:  RUE: 5/5 proximal to distal RLE: 5/5 proximal to distal  LUE/LLE: 5/5 throughout  No increase in tone noted Skin:  Intact. Warm and dry. Psych: Normal mood. Normal affect.    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   1. 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 Chronic Pain syndrome, greatest at left greater trochanter:   D/ced Gabapentin 100 TID due to nausea  D/ced Mobic due lack of efficacy  Encouraged ROM and stretching to left hip, TFL  Will consider Diclofenac in future  Pt had 1 steroid injection without benefit with skin discoloration, will schedule for repeat Lidocaine injection  Will consider Lyrica in future  Will consider Voltaren gel in future  3. Hypertension  Improved  4. Obesity  Dietitian referral (encouraged pt follow up, 3rd time)- pt states she has not received a call, will follow up  Encouraged activity and weight loss.  Discouraged use of "diet pill" again  5. Fibromyalgia  Cont Cymbalta  Being managed by PCP  Pt appears stable

## 2017-02-04 ENCOUNTER — Encounter: Payer: Medicare Other | Admitting: Physical Medicine & Rehabilitation

## 2017-02-12 ENCOUNTER — Encounter: Payer: Self-pay | Admitting: Physical Medicine & Rehabilitation

## 2017-02-12 ENCOUNTER — Encounter: Payer: Medicare Other | Attending: Physical Medicine & Rehabilitation | Admitting: Physical Medicine & Rehabilitation

## 2017-02-12 VITALS — BP 127/84 | HR 92 | Resp 14

## 2017-02-12 DIAGNOSIS — M7062 Trochanteric bursitis, left hip: Secondary | ICD-10-CM | POA: Diagnosis not present

## 2017-02-12 DIAGNOSIS — I69354 Hemiplegia and hemiparesis following cerebral infarction affecting left non-dominant side: Secondary | ICD-10-CM | POA: Diagnosis not present

## 2017-02-12 DIAGNOSIS — E785 Hyperlipidemia, unspecified: Secondary | ICD-10-CM | POA: Diagnosis not present

## 2017-02-12 DIAGNOSIS — M797 Fibromyalgia: Secondary | ICD-10-CM | POA: Diagnosis not present

## 2017-02-12 DIAGNOSIS — I10 Essential (primary) hypertension: Secondary | ICD-10-CM | POA: Diagnosis not present

## 2017-02-12 DIAGNOSIS — E669 Obesity, unspecified: Secondary | ICD-10-CM | POA: Diagnosis not present

## 2017-02-12 DIAGNOSIS — Z5189 Encounter for other specified aftercare: Secondary | ICD-10-CM | POA: Diagnosis present

## 2017-02-12 NOTE — Progress Notes (Signed)
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  Informed consent was obtained after describing risks and benefits of the procedure with the patient, this includes bleeding, bruising, infection and medication side effects. The patient wishes to proceed and has given written consent. Patient was placed in a seated position. The left greater trochanteric area was marked and prepped with betadine at the point of maximal tenderness. A 22-gauge 2 inch needle was inserted into the greater trochanteric area until bone was felt.  Needle was drawn back and after negative draw back for blood, a 2cc solution containing 2% sensorcaine was injected. A band aid was applied. The patient tolerated the procedure well. Post procedure instructions were given.  Previously patient was injected with steroid, but she did not prefer skin discoloration.

## 2017-02-26 ENCOUNTER — Encounter (HOSPITAL_BASED_OUTPATIENT_CLINIC_OR_DEPARTMENT_OTHER): Payer: Medicare Other | Admitting: Physical Medicine & Rehabilitation

## 2017-02-26 ENCOUNTER — Encounter: Payer: Self-pay | Admitting: Physical Medicine & Rehabilitation

## 2017-02-26 VITALS — BP 133/88 | HR 81 | Resp 14

## 2017-02-26 DIAGNOSIS — I6930 Unspecified sequelae of cerebral infarction: Secondary | ICD-10-CM

## 2017-02-26 DIAGNOSIS — M797 Fibromyalgia: Secondary | ICD-10-CM

## 2017-02-26 DIAGNOSIS — G894 Chronic pain syndrome: Secondary | ICD-10-CM | POA: Diagnosis not present

## 2017-02-26 DIAGNOSIS — I69354 Hemiplegia and hemiparesis following cerebral infarction affecting left non-dominant side: Secondary | ICD-10-CM | POA: Diagnosis not present

## 2017-02-26 DIAGNOSIS — M7062 Trochanteric bursitis, left hip: Secondary | ICD-10-CM | POA: Diagnosis not present

## 2017-02-26 MED ORDER — DICLOFENAC SODIUM 1 % TD GEL: 2.0000 g | Freq: Four times a day (QID) | TRANSDERMAL | 1 refills | Status: AC

## 2017-02-26 NOTE — Progress Notes (Deleted)
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  Informed consent was obtained after describing risks and benefits of the procedure with the patient, this includes bleeding, bruising, infection and medication side effects. The patient wishes to proceed and has given written consent. Patient was placed in a seated position. The left greater trochanteric area was marked and prepped with betadine at the point of maximal tenderness. A 22-gauge 2 inch needle was inserted into the greater trochanteric area until bone was felt.  Needle was drawn back and after negative draw back for blood, a 2cc solution containing 2% sensorcaine was injected. A band aid was applied. The patient tolerated the procedure well. Post procedure instructions were given.  Previously patient was injected with steroid, but she did not prefer skin discoloration.

## 2017-02-26 NOTE — Progress Notes (Signed)
Subjective:    Patient ID: Sharon Roberson, female    DOB: 1965-12-12, 51 y.o.   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   Last clinic visit 02/12/17.  At that time, she had a left greater trochanteric injection with anesthetic only.  She states she had good benefit for >week. She has an appointment with Neurology.  She states she is doing her exercises.  Pt states she still has not received a call from dietary, but is confused.     Pain Inventory Average Pain 5 Pain Right Now 5 My pain is sharp and stabbing  In the last 24 hours, has pain interfered with the following? General activity 7 Relation with others 7 Enjoyment of life 7 What TIME of day is your pain at its worst? morning Sleep (in general) Poor  Pain is worse with: walking, sitting and some activites Pain improves with: heat/ice Relief from Meds: 4  Mobility how many minutes can you walk? 15 ability to climb steps?  no do you drive?  no  Function disabled: date disabled 1999 Do you have any goals in this area?  no  Neuro/Psych weakness tingling trouble walking depression  Prior Studies Any changes since last visit?  no  Physicians involved in your care Any changes since last visit?  no Primary care n/a Neurologist n/a   Family History  Problem Relation Age of Onset  . Kidney cancer Mother        died 54yr  (had kidney transplant)  . Lung cancer Mother   . Stroke Father        died 62y  Gallstones  . Hypertension Sister   . Thyroid disease Sister   . Bone cancer Brother        stem cell transplant  . Diabetes Brother   . Hypertension Brother   . Heart failure Sister   . Hypertension Sister    Social History   Social History  . Marital status: Significant Other    Spouse name: N/A  . Number of children: 1  . Years of education: college    Occupational History  . Disability     Social History Main Topics  . Smoking status: Former Smoker    Packs/day: 0.50    Types: Cigarettes    Quit date: 04/23/2016  . Smokeless tobacco: Never Used  . Alcohol use Yes     Comment: occasional  . Drug use: Yes    Types: Marijuana     Comment: marijuana: occasional  . Sexual activity: Not Asked   Other Topics Concern  . None   Social History Narrative   Right handed.   Caffeine some (soda every now and then)   Living home. One child (27 yr)   Disabled.     Past Surgical History:  Procedure Laterality Date  . KIDNEY STONE SURGERY     1999,2000,2001,2002,2003,2004  . LUNG SURGERY  2003  . THYROID SURGERY     Past Medical History:  Diagnosis Date  . Eyelid cyst, right 07/17/2016  . Hypercholesteremia   . Hypertension   . Pneumothorax   . Sarcoidosis   . Stroke (HCC)    BP 133/88 (BP Location: Left Arm, Patient Position: Sitting, Cuff Size: Large)   Pulse 81   Resp 14   SpO2 94%   Opioid Risk Score:   Fall Risk Score:  `1  Depression  screen PHQ 2/9  No flowsheet data found.  Review of Systems  HENT: Negative.   Eyes: Negative.   Respiratory: Negative.   Cardiovascular: Negative.   Gastrointestinal: Negative for constipation.  Endocrine: Negative.        High blood sugar  Genitourinary: Negative.   Musculoskeletal: Positive for arthralgias, gait problem and joint swelling.  Skin: Negative.   Allergic/Immunologic: Negative.   Neurological: Positive for weakness.       Spasms  Hematological: Negative.   Psychiatric/Behavioral: Positive for dysphoric mood.  All other systems reviewed and are negative.     Objective:   Physical Exam  Gen NAD. Vital signs reviewed. HEENT: normocephalic, atraumatic Cardio: RRR. No JVD. Resp: CTA B/L and unlabored GI: BS positive and NT/ND Musc/Skel:  No edema. +TTP left trochanteric bursa. Neuro: Alert and Oriented DTRs brisk throughout Motor:  RUE: 5/5 proximal  to distal RLE: 5/5 proximal to distal  LUE/LLE: 5/5 throughout  No increase in tone noted Skin:  Intact. Warm and dry. Psych: Normal mood. Normal affect.    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   1. 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  Will perform again in 2 weeks             Will consider Lyrica in future             Will order Voltaren gel   3. Hypertension  Controlled today  4. Obesity             Dietitian referral (encouraged pt follow up, 4th time)- pt states she has not received a call, then states she did, is confused             Encouraged activity and weight loss.             Discouraged use of "diet pill" again  5. Fibromyalgia             Cont Cymbalta             Pt appears stable at present

## 2017-03-12 ENCOUNTER — Encounter: Payer: Medicare Other | Attending: Physical Medicine & Rehabilitation | Admitting: Physical Medicine & Rehabilitation

## 2017-03-12 ENCOUNTER — Encounter: Payer: Self-pay | Admitting: Physical Medicine & Rehabilitation

## 2017-03-12 VITALS — BP 118/80 | HR 92 | Resp 14

## 2017-03-12 DIAGNOSIS — E785 Hyperlipidemia, unspecified: Secondary | ICD-10-CM | POA: Insufficient documentation

## 2017-03-12 DIAGNOSIS — Z5189 Encounter for other specified aftercare: Secondary | ICD-10-CM | POA: Diagnosis present

## 2017-03-12 DIAGNOSIS — I10 Essential (primary) hypertension: Secondary | ICD-10-CM | POA: Diagnosis not present

## 2017-03-12 DIAGNOSIS — I69354 Hemiplegia and hemiparesis following cerebral infarction affecting left non-dominant side: Secondary | ICD-10-CM | POA: Diagnosis not present

## 2017-03-12 DIAGNOSIS — M7062 Trochanteric bursitis, left hip: Secondary | ICD-10-CM | POA: Diagnosis not present

## 2017-03-12 DIAGNOSIS — E669 Obesity, unspecified: Secondary | ICD-10-CM | POA: Diagnosis not present

## 2017-03-12 DIAGNOSIS — M797 Fibromyalgia: Secondary | ICD-10-CM | POA: Insufficient documentation

## 2017-03-12 NOTE — Progress Notes (Signed)
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  Informed consent was obtained after describing risks and benefits of the procedure with the patient, this includes bleeding, bruising, infection and medication side effects. The patient wishes to proceed and has given written consent. Patient was placed in a seated position. The left greater trochanteric area was marked and prepped with alcohol at the point of maximal tenderness. Vapocoolant spray was sprayed.  A 22-gauge 2 inch needle was inserted into the left greater trochanteric area until bone was felt.  Needle was drawn back and after negative draw back for blood, a 4cc solution containing 2% sensorcaine was injected. A band aid was applied. The patient tolerated the procedure well. Post procedure instructions were given.

## 2017-03-19 ENCOUNTER — Encounter (INDEPENDENT_AMBULATORY_CARE_PROVIDER_SITE_OTHER): Payer: Self-pay

## 2017-03-19 ENCOUNTER — Encounter: Payer: Self-pay | Admitting: Nurse Practitioner

## 2017-03-19 ENCOUNTER — Ambulatory Visit (INDEPENDENT_AMBULATORY_CARE_PROVIDER_SITE_OTHER): Payer: Medicare Other | Admitting: Nurse Practitioner

## 2017-03-19 VITALS — BP 136/91 | HR 98 | Ht <= 58 in | Wt 187.8 lb

## 2017-03-19 DIAGNOSIS — E785 Hyperlipidemia, unspecified: Secondary | ICD-10-CM | POA: Diagnosis not present

## 2017-03-19 DIAGNOSIS — I6350 Cerebral infarction due to unspecified occlusion or stenosis of unspecified cerebral artery: Secondary | ICD-10-CM

## 2017-03-19 DIAGNOSIS — I10 Essential (primary) hypertension: Secondary | ICD-10-CM

## 2017-03-19 DIAGNOSIS — I639 Cerebral infarction, unspecified: Secondary | ICD-10-CM

## 2017-03-19 NOTE — Patient Instructions (Addendum)
Stressed the importance of management of risk factors to prevent further stroke Continue Aspirin for secondary stroke prevention Maintain strict control of hypertension with blood pressure goal below 130/90,Continue antihypertensive medications todays reading 136/91 Control of diabetes with hemoglobin A1c below 6.5 follow by PCP Cholesterol with LDL cholesterol less than 70, followed by primary care, Continue Zocor  Exercise by  water aerobics,  eat healthy diet with whole grains,  fresh fruits and vegetables no fast food , junk food Will discharge from stroke clinic Stroke Prevention Some medical conditions and behaviors are associated with an increased chance of having a stroke. You may prevent a stroke by making healthy choices and managing medical conditions. How can I reduce my risk of having a stroke?  Stay physically active. Get at least 30 minutes of activity on most or all days.  Do not smoke. It may also be helpful to avoid exposure to secondhand smoke.  Limit alcohol use. Moderate alcohol use is considered to be: ? No more than 2 drinks per day for men. ? No more than 1 drink per day for nonpregnant women.  Eat healthy foods. This involves: ? Eating 5 or more servings of fruits and vegetables a day. ? Making dietary changes that address high blood pressure (hypertension), high cholesterol, diabetes, or obesity.  Manage your cholesterol levels. ? Making food choices that are high in fiber and low in saturated fat, trans fat, and cholesterol may control cholesterol levels. ? Take any prescribed medicines to control cholesterol as directed by your health care provider.  Manage your diabetes. ? Controlling your carbohydrate and sugar intake is recommended to manage diabetes. ? Take any prescribed medicines to control diabetes as directed by your health care provider.  Control your hypertension. ? Making food choices that are low in salt (sodium), saturated fat, trans fat, and  cholesterol is recommended to manage hypertension. ? Ask your health care provider if you need treatment to lower your blood pressure. Take any prescribed medicines to control hypertension as directed by your health care provider. ? If you are 9918-51 years of age, have your blood pressure checked every 3-5 years. If you are 51 years of age or older, have your blood pressure checked every year.  Maintain a healthy weight. ? Reducing calorie intake and making food choices that are low in sodium, saturated fat, trans fat, and cholesterol are recommended to manage weight.  Stop drug abuse.  Avoid taking birth control pills. ? Talk to your health care provider about the risks of taking birth control pills if you are over 51 years old, smoke, get migraines, or have ever had a blood clot.  Get evaluated for sleep disorders (sleep apnea). ? Talk to your health care provider about getting a sleep evaluation if you snore a lot or have excessive sleepiness.  Take medicines only as directed by your health care provider. ? For some people, aspirin or blood thinners (anticoagulants) are helpful in reducing the risk of forming abnormal blood clots that can lead to stroke. If you have the irregular heart rhythm of atrial fibrillation, you should be on a blood thinner unless there is a good reason you cannot take them. ? Understand all your medicine instructions.  Make sure that other conditions (such as anemia or atherosclerosis) are addressed. Get help right away if:  You have sudden weakness or numbness of the face, arm, or leg, especially on one side of the body.  Your face or eyelid droops to one side.  You have sudden confusion.  You have trouble speaking (aphasia) or understanding.  You have sudden trouble seeing in one or both eyes.  You have sudden trouble walking.  You have dizziness.  You have a loss of balance or coordination.  You have a sudden, severe headache with no known  cause.  You have new chest pain or an irregular heartbeat. Any of these symptoms may represent a serious problem that is an emergency. Do not wait to see if the symptoms will go away. Get medical help at once. Call your local emergency services (911 in [place] Do not drive yourself to the hospital. This information is not intended to replace advice given to you by your health care provider. Make sure you discuss any questions you have with your health care provider. Document Released: 10/30/2004 Document Revised: 02/28/2016 Document Reviewed: 03/25/2013 Elsevier Interactive Patient Education  2017 [employer].

## 2017-03-19 NOTE — Progress Notes (Signed)
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 She has not had further stroke or TIA symptoms since that time she remains on aspirin 325 daily with no bruising and no bleeding. Blood pressure in the office today 136/91. She remains on Zocor for hyperlipidemia without complaints of myalgias. Her cardiac event monitor was negative for any irregular heartbeats. She gets little exercise. She did quit smoking after her stroke. She is moving back to [place] in August. She returns for reevaluation  UPDATE 12/14/2017CM Sharon Roberson, 51 year old female returns for follow-up. 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 She is currently on aspirin for secondary stroke prevention without further stroke or TIA symptoms. In addition she is on Zocor 80 mg at bedtime. She denies any myalgias. Blood pressure medication was recently changed to Losartan/ HCTZ  and is 145/95 the office today. Her rehabilitation has concluded her physical therapy. 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 The cardiac event monitor was negative for any irregular heartbeats. She has not had any neurologic complaints. She returns for reevaluation  HISTORY 9/14/17CMInez Roberson is a 51 y.o. female with right sided weakness that was present on awakening 04/23/16.  Her daughter noticed that her speech was slurred a little bit later and therefore EMS was called. 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 She is not sure of any symptoms of neurosarcoidosis that she has other than headaches. MRI  left pontine infarct.  left cingulate gyrus linear DWI changes suspicious for infarct but atypical. MRA unremarkable  Carotid Doppler unremarkable 2D Echo  EF 60-65 Due to atypical linear DWI changes at left cingulate gyrus, will recommend 30 day cardiac event monitoring as outpt. Please arrange on discharge. LDL 147  HgbA1c 5.6 she returns to the clinic today for follow-up visit. She denies further stroke or TIA symptoms. She is currently on aspirin for secondary stroke prevention. She is on Zocor 80 mg daily without complaints of myalgias. She had a fall several weeks ago going up steps, she continues to have mild lower extremity weakness. She has not had cardiac monitoring 30 day event arranged as yet.  She did quit smoking and was congratulated on this. She also complains today of morning headache daytime drowsiness and her partner says she snores at night. She has never had a sleep study. She returns for reevaluation   REVIEW OF SYSTEMS: Full 14 system review of systems performed and notable only for those listed, all others are neg:  Constitutional: neg Cardiovascular: neg Ear/Nose/Throat: neg  Skin: neg Eyes: neg Respiratory: Cough Gastroitestinal: neg  Hematology/Lymphatic: neg  Endocrine: neg Musculoskeletal: Joint pain Allergy/Immunology: neg Neurological: Headache,  Psychiatric: Depression Sleep : Daytime sleepiness sleep study showed supine sleep apnea and she was encouraged to sleep on her side   ALLERGIES: Allergies  Allergen Reactions  . Penicillins Anaphylaxis    Has patient had a PCN reaction causing  immediate rash, facial/tongue/throat swelling, SOB or lightheadedness with hypotension: Yes Has patient had a PCN reaction causing severe rash involving mucus membranes or skin  necrosis: No Has patient had a PCN reaction that required hospitalization Yes Has patient had a PCN reaction occurring within the last 10 years: Yes If all of the above answers are "NO", then may proceed with Cephalosporin use.   . Sulfamethoxazole Anaphylaxis  . Ultram [Tramadol Hcl] Shortness Of Breath  . Wellbutrin [Bupropion] Shortness Of Breath  . Iodine Swelling  . Trazodone And Nefazodone Other (See Comments)    Hypotension, diaphoretic    HOME MEDICATIONS: Outpatient Medications Prior to Visit  Medication Sig Dispense Refill  . amLODipine (NORVASC) 5 MG tablet Take 5 mg by mouth daily.    Marland Kitchen aspirin 325 MG tablet Take 1 tablet (325 mg total) by mouth daily. 30 tablet 0  . diclofenac sodium (VOLTAREN) 1 % GEL Apply 2 g topically 4 (four) times daily. 1 Tube 1  . Doxepin HCl (SILENOR) 3 MG TABS Take 3 mg by mouth daily.    . DULoxetine (CYMBALTA) 60 MG capsule Take 60 mg by mouth daily.    . lansoprazole (PREVACID) 15 MG capsule Take 15 mg by mouth daily at 12 noon.    Marland Kitchen losartan-hydrochlorothiazide (HYZAAR) 50-12.5 MG tablet Take 1 tablet by mouth daily.   0  . pantoprazole (PROTONIX) 40 MG tablet   4  . simvastatin (ZOCOR) 80 MG tablet Take 1 tablet (80 mg total) by mouth at bedtime. 30 tablet 0   No facility-administered medications prior to visit.     PAST MEDICAL HISTORY: Past Medical History:  Diagnosis Date  . Eyelid cyst, right 07/17/2016  . Hypercholesteremia   . Hypertension   . Pneumothorax   . Sarcoidosis   . Stroke [facility])     PAST SURGICAL HISTORY: Past Surgical History:  Procedure Laterality Date  . KIDNEY STONE SURGERY     1999,2000,2001,2002,2003,2004  . LUNG SURGERY  2003  . THYROID SURGERY      FAMILY HISTORY: Family History  Problem Relation Age of Onset  . Kidney cancer Mother        died 50yr  (had kidney transplant)  . Lung cancer Mother   . Stroke Father        died 62y  Gallstones  . Hypertension Sister   . Thyroid disease Sister     . Bone cancer Brother        stem cell transplant  . Diabetes Brother   . Hypertension Brother   . Heart failure Sister   . Hypertension Sister     SOCIAL HISTORY: Social History   Social History  . Marital status: Significant Other    Spouse name: N/A  . Number of children: 1  . Years of education: college   Occupational History  . Disability     Social History Main Topics  . Smoking status: Former Smoker    Packs/day: 0.50    Types: Cigarettes    Quit date: 04/23/2016  . Smokeless tobacco: Never Used  . Alcohol use Yes     Comment: occasional  . Drug use: Yes    Types: Marijuana     Comment: marijuana: occasional  . Sexual activity: Not on file   Other Topics Concern  . Not on file   Social History Narrative   Right handed.   Caffeine some (soda every now and then)   Living home. One child (27 yr)   Disabled.  PHYSICAL EXAM  Vitals:   03/19/17 1045  BP: (!) 136/91  Pulse: 98  Weight: 187 lb 12.8 oz (85.2 kg)  Height: 4\' 10"  (1.473 m)   Body mass index is 39.25 kg/m.  Generalized: Well developed, Obese female in no acute distress  Head: normocephalic and atraumatic,. Oropharynx benign  Neck: Supple, no carotid bruits  Cardiac: Regular rate rhythm, no murmur  Musculoskeletal: No deformity   Neurological examination   Mentation: Alert oriented to time, place, history taking. Attention span and concentration appropriate. Recent and remote memory intact.  Follows all commands speech and language fluent.   Cranial nerve II-XII: Pupils were equal round reactive to light extraocular movements were full, visual field were full on confrontational test. Facial sensation and strength were normal. hearing was intact to finger rubbing bilaterally. Uvula tongue midline. head turning and shoulder shrug were normal and symmetric.Tongue protrusion into cheek strength was normal. Motor: normal bulk and tone, full strength in the BUE, BLE Sensory: normal and  symmetric to light touch, pinprick, and  Vibration, in the upper and lower extremities Coordination: finger-nose-finger, heel-to-shin bilaterally, no dysmetria Reflexes: Symmetric upper and lower, plantar responses were flexor bilaterally. Gait and Station: Rising up from seated position without assistance, normal stance,  moderate stride, good arm swing, smooth turning, able to perform tiptoe, and heel walking without difficulty. Tandem gait is steady  DIAGNOSTIC DATA (LABS, IMAGING, TESTING) - I reviewed patient records, labs, notes, testing and imaging myself where available.  Lab Results  Component Value Date   WBC 6.0 04/28/2016   HGB 14.7 04/28/2016   HCT 43.1 04/28/2016   MCV 91.3 04/28/2016   PLT 232 04/28/2016      Component Value Date/Time   NA 138 04/28/2016 0706   K 3.6 04/28/2016 0706   CL 103 04/28/2016 0706   CO2 27 04/28/2016 0706   GLUCOSE 103 (H) 04/28/2016 0706   BUN 10 04/28/2016 0706   CREATININE 0.73 04/28/2016 0706   CALCIUM 9.6 04/28/2016 0706   PROT 8.3 (H) 04/28/2016 0706   ALBUMIN 4.0 04/28/2016 0706   AST 24 04/28/2016 0706   ALT 17 04/28/2016 0706   ALKPHOS 88 04/28/2016 0706   BILITOT 0.6 04/28/2016 0706   GFRNONAA >60 04/28/2016 0706   GFRAA >60 04/28/2016 0706   Lab Results  Component Value Date   CHOL 251 (H) 04/24/2016   HDL 54 04/24/2016   LDLCALC 147 (H) 04/24/2016   TRIG 250 (H) 04/24/2016   CHOLHDL 4.6 04/24/2016   Lab Results  Component Value Date   HGBA1C 5.6 04/24/2016    ASSESSMENT AND PLAN  51 y.o. year old female  has a past medical history of Eyelid cyst, right (07/17/2016); Hypercholesteremia; Hypertension; Pneumothorax; Sarcoidosis; and Stroke (HCC). here To follow-up for left pontine infarct Which occurred in August 2017. She has not had further stroke or TIA symptoms. The patient is a current patient of Dr. Roda ShuttersXu  who is out of the office today . This note is sent to the work in doctor.       PLAN: Stressed the  importance of management of risk factors to prevent further stroke Continue Aspirin for secondary stroke prevention Maintain strict control of hypertension with blood pressure goal below 130/90,Continue antihypertensive medications todays reading 136/91 Control of diabetes with hemoglobin A1c below 6.5 follow by PCP Cholesterol with LDL cholesterol less than 70, followed by primary care, Continue Zocor  Exercise by  water aerobics,  eat healthy diet with whole grains,  fresh  fruits and vegetables no fast food , junk food Will discharge from stroke clinic Patient is moving to [place] and  will obtain her medical records from this office. I spent 25 min  in total face to face time with the patient more than 50% of which was spent counseling and coordination of care, reviewing test results reviewing medications and discussing and reviewing the diagnosis of stroke and management of stroke risk factors. Nilda Riggs, [facility], [facility], APRN  [facility] [address], [address] [place], [place] [address] [phone]

## 2017-03-20 NOTE — Progress Notes (Signed)
I have reviewed and agreed above plan. 

## 2017-04-01 ENCOUNTER — Encounter: Payer: Medicare Other | Admitting: Physical Medicine & Rehabilitation

## 2017-04-22 ENCOUNTER — Encounter: Payer: Medicare Other | Attending: Physical Medicine & Rehabilitation | Admitting: Physical Medicine & Rehabilitation

## 2017-04-22 ENCOUNTER — Encounter: Payer: Self-pay | Admitting: Physical Medicine & Rehabilitation

## 2017-04-22 VITALS — BP 144/94 | HR 83 | Resp 14 | Wt 189.0 lb

## 2017-04-22 DIAGNOSIS — E785 Hyperlipidemia, unspecified: Secondary | ICD-10-CM | POA: Insufficient documentation

## 2017-04-22 DIAGNOSIS — M7062 Trochanteric bursitis, left hip: Secondary | ICD-10-CM | POA: Diagnosis not present

## 2017-04-22 DIAGNOSIS — I69354 Hemiplegia and hemiparesis following cerebral infarction affecting left non-dominant side: Secondary | ICD-10-CM | POA: Diagnosis not present

## 2017-04-22 DIAGNOSIS — M797 Fibromyalgia: Secondary | ICD-10-CM

## 2017-04-22 DIAGNOSIS — I10 Essential (primary) hypertension: Secondary | ICD-10-CM | POA: Insufficient documentation

## 2017-04-22 DIAGNOSIS — G894 Chronic pain syndrome: Secondary | ICD-10-CM | POA: Diagnosis not present

## 2017-04-22 DIAGNOSIS — Z5189 Encounter for other specified aftercare: Secondary | ICD-10-CM | POA: Diagnosis present

## 2017-04-22 DIAGNOSIS — E669 Obesity, unspecified: Secondary | ICD-10-CM | POA: Diagnosis not present

## 2017-04-22 NOTE — Progress Notes (Deleted)
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  Informed consent was obtained after describing risks and benefits of the procedure with the patient, this includes bleeding, bruising, infection and medication side effects. The patient wishes to proceed and has given written consent. Patient was placed in a seated position. The left greater trochanteric area was marked and prepped with alcohol at the point of maximal tenderness. Vapocoolant spray was sprayed.  A 22-gauge 2 inch needle was inserted into the left greater trochanteric area until bone was felt.  Needle was drawn back and after negative draw back for blood, a 4cc solution containing 2% sensorcaine was injected. A band aid was applied. The patient tolerated the procedure well. Post procedure instructions were given.

## 2017-04-22 NOTE — Progress Notes (Signed)
Subjective:    Patient ID: Sharon Roberson, female    DOB: 1966-02-04, 51 y.o.   MRN: [id number]  HPI  51 year old right-handed female, history of hypertension, hyperlipidemia, tobacco abuse presents for follow up for left pontine and left cingulate gyrus infarct.   Last clinic visit 03/12/17. At that time, she had a lidocaine injection to her left greater trochanter.  Since last visit, she saw Neurology and was released.  Pt states she is moving to [place].  She would like to hold off on PT and do it in [place]. She did well with the injection.  She gets relief with Voltaren gel.  She states no one has called her for her dietitian appointment.  Today she complains of intermittent back pain.    Pain Inventory Average Pain 7 Pain Right Now 7 My pain is sharp and stabbing  In the last 24 hours, has pain interfered with the following? General activity 7 Relation with others 7 Enjoyment of life 7 What TIME of day is your pain at its worst? morning Sleep (in general) Poor  Pain is worse with: walking, sitting and some activites Pain improves with: heat/ice and injections Relief from Meds: 3  Mobility walk without assistance how many minutes can you walk? 15 ability to climb steps?  no do you drive?  no Do you have any goals in this area?  no  Function disabled: date disabled . Do you have any goals in this area?  no  Neuro/Psych No problems in this area  Prior Studies Any changes since last visit?  no  Physicians involved in your care Any changes since last visit?  no   Family History  Problem Relation Age of Onset  . Kidney cancer Mother        died 34yr  (had kidney transplant)  . Lung cancer Mother   . Stroke Father        died 62y  Gallstones  . Hypertension Sister   . Thyroid disease Sister   . Bone cancer Brother        stem cell transplant  . Diabetes Brother   . Hypertension Brother   . Heart failure Sister   . Hypertension Sister    Social History    Social History  . Marital status: Significant Other    Spouse name: N/A  . Number of children: 1  . Years of education: college   Occupational History  . Disability     Social History Main Topics  . Smoking status: Former Smoker    Packs/day: 0.50    Types: Cigarettes    Quit date: 04/23/2016  . Smokeless tobacco: Never Used  . Alcohol use Yes     Comment: occasional  . Drug use: Yes    Types: Marijuana     Comment: marijuana: occasional  . Sexual activity: Not Asked   Other Topics Concern  . None   Social History Narrative   Right handed.   Caffeine some (soda every now and then)   Living home. One child (27 yr)   Disabled.     Past Surgical History:  Procedure Laterality Date  . KIDNEY STONE SURGERY     1999,2000,2001,2002,2003,2004  . LUNG SURGERY  2003  . THYROID SURGERY     Past Medical History:  Diagnosis Date  . Eyelid cyst, right 07/17/2016  . Hypercholesteremia   . Hypertension   . Pneumothorax   . Sarcoidosis   . Stroke (HCC)    BP (!) 144/94 (  BP Location: Right Arm, Patient Position: Sitting, Cuff Size: Normal)   Pulse 83   Resp 14   Wt 189 lb (85.7 kg)   SpO2 95%   BMI 39.50 kg/m   Opioid Risk Score:   Fall Risk Score:  `1  Depression screen PHQ 2/9  No flowsheet data found.  Review of Systems  HENT: Negative.   Eyes: Negative.   Respiratory: Negative.   Cardiovascular: Negative.   Gastrointestinal: Negative for constipation.  Endocrine: Negative.        High blood sugar  Genitourinary: Negative.   Musculoskeletal: Positive for arthralgias, back pain, gait problem and joint swelling.  Skin: Negative.   Allergic/Immunologic: Negative.   Neurological:       Spasms  Hematological: Negative.   Psychiatric/Behavioral: Negative.   All other systems reviewed and are negative.     Objective:   Physical Exam  Gen NAD. Vital signs reviewed. HEENT: normocephalic, atraumatic Cardio: RRR. No JVD. Resp: CTA B/L and unlabored GI:  BS positive and NT/ND Musc/Skel:  No edema.   No TTP left greater trochanter.  TTP along iliac crest Neuro: Alert and Oriented Motor:  RUE: 5/5 proximal to distal RLE: 5/5 proximal to distal  LUE/LLE: 5/5 throughout  No increase in tone noted Skin:  Intact. Warm and dry. Psych: Normal mood. Normal affect.    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   1. 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 Obesity  Dietitian referral (encouraged pt follow up, 4th time)- pt states she has not received a call again              Encouraged activity and weight loss.             Discouraged use of "diet pill" again  5. Fibromyalgia             Cont Cymbalta             Pt appears stable at present

## 2017-04-22 NOTE — Addendum Note (Signed)
Addended by: Maryla MorrowPATEL, ANKIT A on: 04/22/2017 11:45 AM   Modules accepted: Orders

## 2017-05-13 ENCOUNTER — Encounter: Payer: Self-pay | Admitting: Physical Medicine & Rehabilitation

## 2017-05-13 ENCOUNTER — Encounter: Payer: Medicare Other | Attending: Physical Medicine & Rehabilitation | Admitting: Physical Medicine & Rehabilitation

## 2017-05-13 VITALS — BP 118/84 | HR 82

## 2017-05-13 DIAGNOSIS — I69354 Hemiplegia and hemiparesis following cerebral infarction affecting left non-dominant side: Secondary | ICD-10-CM | POA: Diagnosis not present

## 2017-05-13 DIAGNOSIS — E669 Obesity, unspecified: Secondary | ICD-10-CM | POA: Insufficient documentation

## 2017-05-13 DIAGNOSIS — M797 Fibromyalgia: Secondary | ICD-10-CM | POA: Insufficient documentation

## 2017-05-13 DIAGNOSIS — I10 Essential (primary) hypertension: Secondary | ICD-10-CM | POA: Insufficient documentation

## 2017-05-13 DIAGNOSIS — E785 Hyperlipidemia, unspecified: Secondary | ICD-10-CM | POA: Diagnosis not present

## 2017-05-13 DIAGNOSIS — Z5189 Encounter for other specified aftercare: Secondary | ICD-10-CM | POA: Diagnosis present

## 2017-05-13 DIAGNOSIS — M7062 Trochanteric bursitis, left hip: Secondary | ICD-10-CM | POA: Diagnosis not present

## 2017-05-13 NOTE — Progress Notes (Signed)
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  Informed consent was obtained after describing risks and benefits of the procedure with the patient, this includes bleeding, bruising, infection and medication side effects. The patient wishes to proceed and has given written consent. Patient was placed in a seated position. The left greater trochanteric area was marked and prepped with alcohol at the point of maximal tenderness. Vapocoolant spray was sprayed.  A 25-gauge 1.5 inch needle was inserted into the left greater trochanteric area until bone was felt.  Needle was drawn back and after negative draw back for blood, a 4cc solution containing 2% sensorcaine was injected. A band aid was applied. The patient tolerated the procedure well. Post procedure instructions were given.

## 2017-05-18 ENCOUNTER — Ambulatory Visit: Payer: Medicare Other | Admitting: Registered"

## 2017-06-10 ENCOUNTER — Encounter: Payer: Medicare Other | Admitting: Physical Medicine & Rehabilitation

## 2017-06-13 IMAGING — MR MR HEAD WO/W CM
11 of 16 series · 28 of 48 positions shown · IV contrast (multihance)
Comparison: Head CT 04/23/2016

CLINICAL DATA: New onset right-sided weakness. Slurred speech.
History of hypertension, hypercholesterolemia, and sarcoidosis.

EXAM:
MRI HEAD WITHOUT AND WITH CONTRAST
MRA HEAD WITHOUT CONTRAST
TECHNIQUE: Multiplanar, multiecho pulse sequences of the brain and surrounding
structures were obtained without and with intravenous contrast.
Angiographic images of the head were obtained using MRA technique
without contrast.
CONTRAST:  15mL MULTIHANCE GADOBENATE DIMEGLUMINE 529 MG/ML IV SOLN

[Series 2: FLAIR · sagittal · 5.0mm · 0.47mm/px · 1 of 23 slices shown (1 of 2)]
[im 1/23]
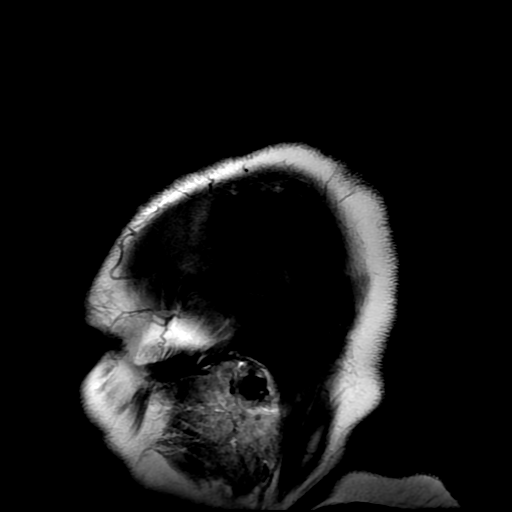

[Series 4: DWI · axial · 3.0mm · 0.94mm/px · z∈[-116,+30]mm · 5 of 100 slices shown (1 of 2)]
[im 1/100]
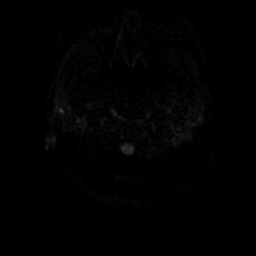
[im 25/100]
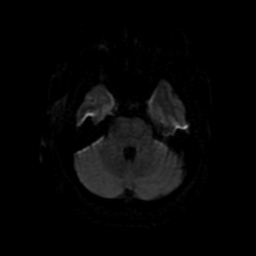
[im 50/100]
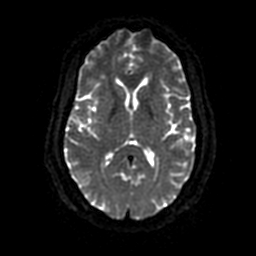
[im 75/100]
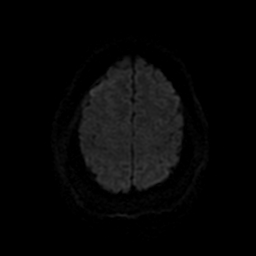
[im 100/100]
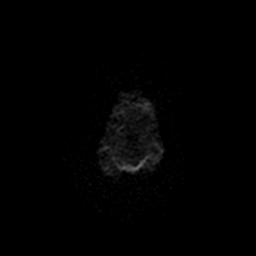

[Series 6: T2 · axial · 5.0mm · 0.47mm/px · 1 of 26 slices shown (1 of 2)]
[im 1/26]
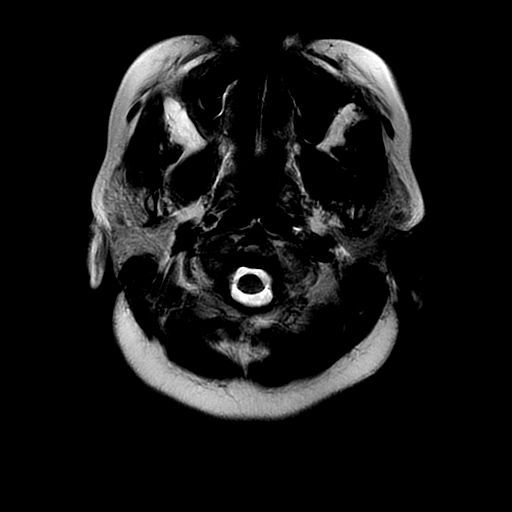

[Series 7: FLAIR · axial · 5.0mm · 0.47mm/px · 1 of 26 slices shown (2 of 2)]
[im 1/26]
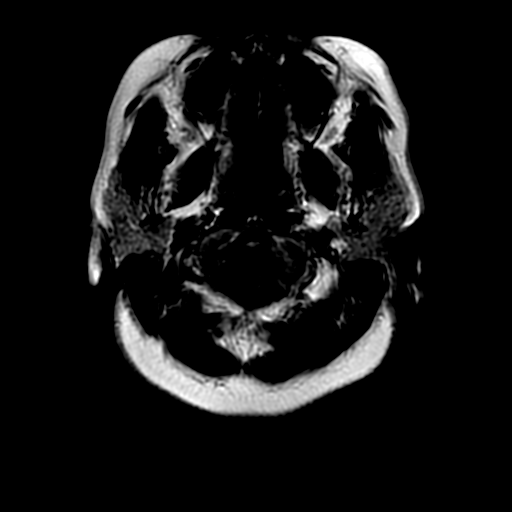

[Series 8: ax (id) fast · axial · 2.8mm · 0.43mm/px · z∈[-111,-45]mm · 5 of 120 slices shown]
[im 1/120]
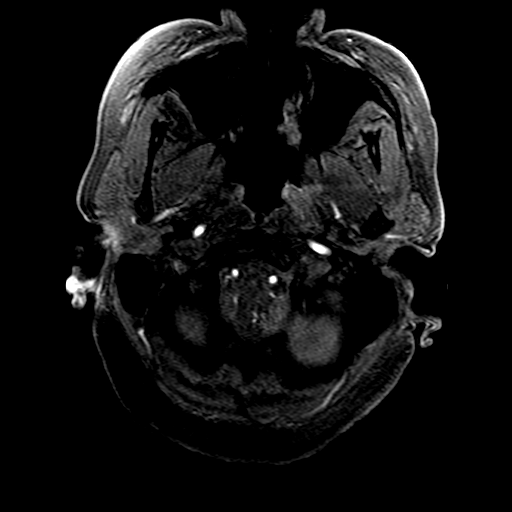
[im 24/120]
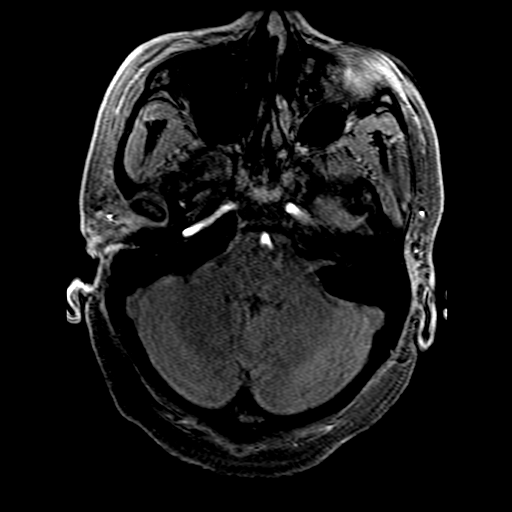
[im 48/120]
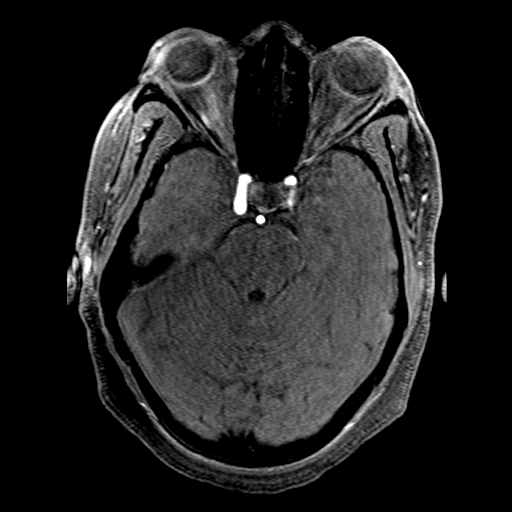
[im 72/120]
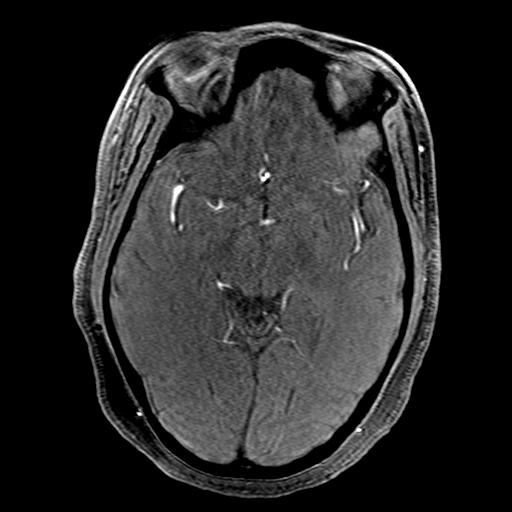
[im 96/120]
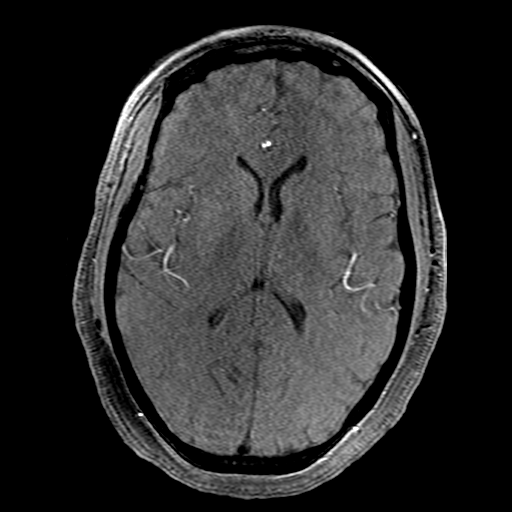

[Series 9: DWI · coronal · 4.0mm · 0.94mm/px · 4 of 68 slices shown (2 of 2)]
[im 1/68]
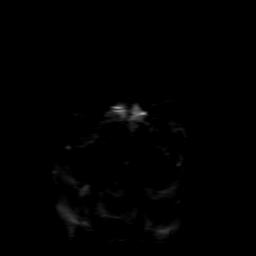
[im 23/68]
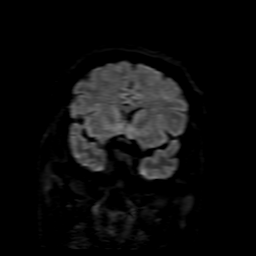
[im 45/68]
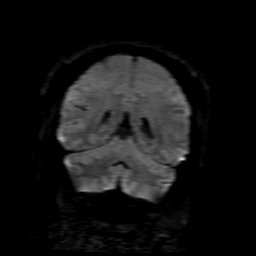
[im 68/68]
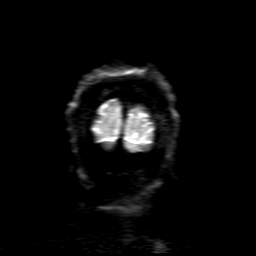

[Series 12: T2 · coronal · 5.0mm · 0.47mm/px · 2 of 30 slices shown (2 of 2)]
[im 1/30]
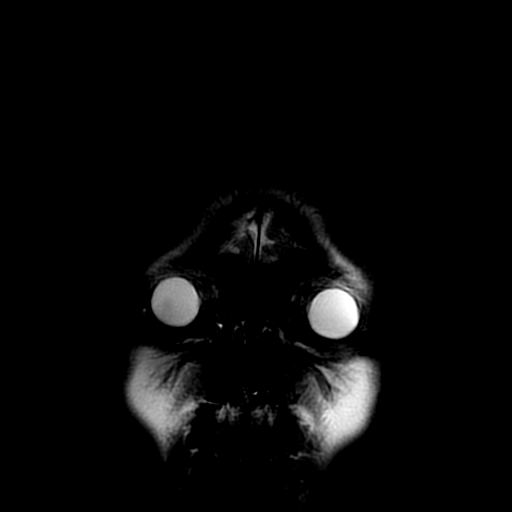
[im 30/30]
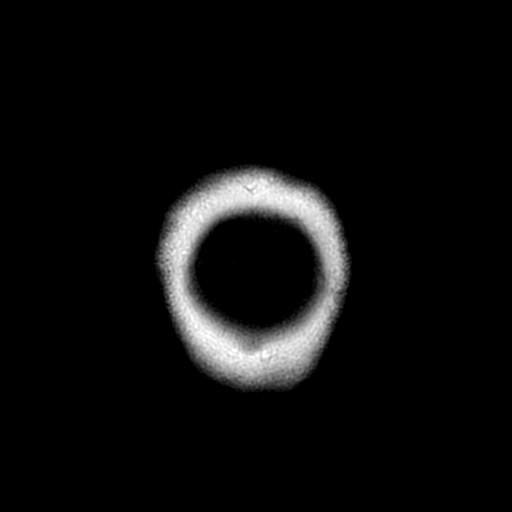

[Series 14: T1 · axial · 5.0mm · 0.47mm/px · z∈[-123,+26]mm · 2 of 26 slices shown (1 of 2)]
[im 1/26]
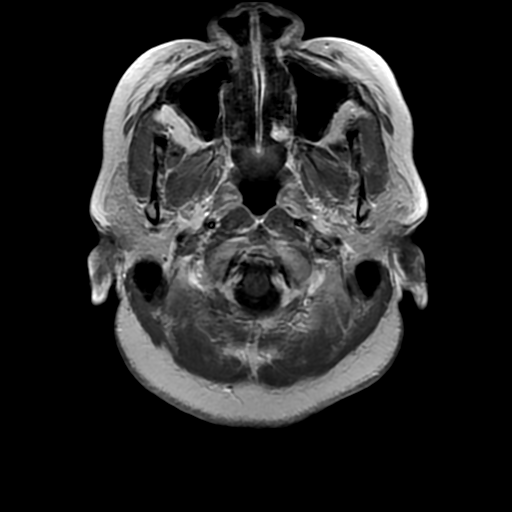
[im 26/26]
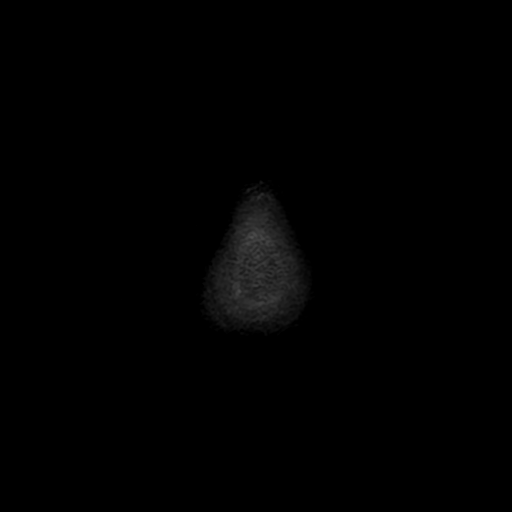

[Series 15: T1 · coronal · 5.0mm · 0.43mm/px · 2 of 29 slices shown (2 of 2)]
[im 1/29]
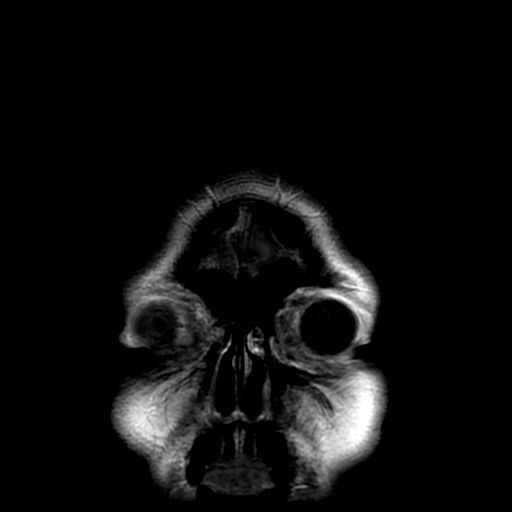
[im 29/29]
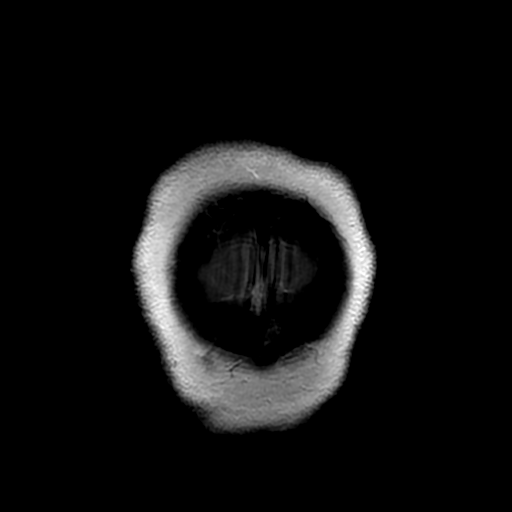

[Series 450: ADC · axial · 3.0mm · 0.94mm/px · z∈[-116,+30]mm · 3 of 50 slices shown (1 of 2)]
[im 1/50]
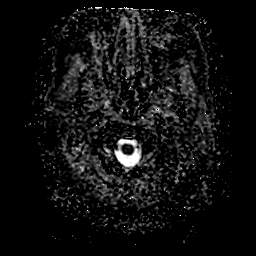
[im 25/50]
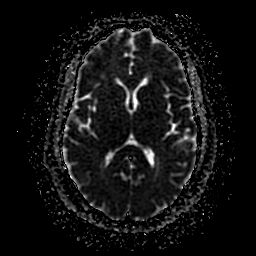
[im 50/50]
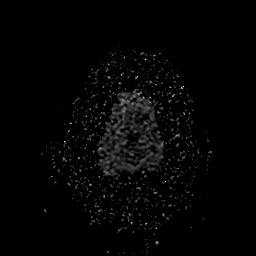

[Series 950: ADC · coronal · 4.0mm · 0.94mm/px · 2 of 34 slices shown (2 of 2)]
[im 1/34]
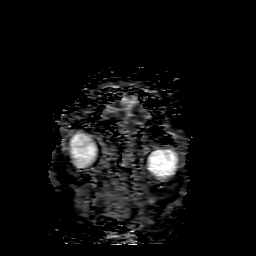
[im 34/34]
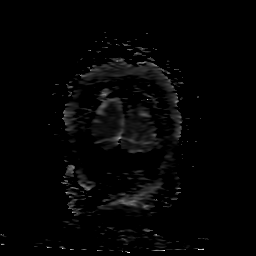

[28 of 48 positions shown; findings below may reference images not displayed]

FINDINGS: MRI HEAD FINDINGS

There is a 9 mm acute infarct in the left cingulate gyrus. There is
also a 1.2 cm acute infarct in the left paramedian pons. There is no
evidence of intracranial hemorrhage, mass, midline shift, or
extra-axial fluid collection. The ventricles and sulci are normal.
Patchy T2 hyperintensities are present in the predominantly deep
cerebral white matter bilaterally and are moderately advanced for
age. No abnormal brain parenchymal or meningeal enhancement is
identified.

Orbits are unremarkable. No significant inflammatory disease is seen
in the paranasal sinuses are mastoid air cells. Major intracranial
vascular flow voids are preserved.

MRA HEAD FINDINGS

There is mild motion artifact.

The visualized distal vertebral arteries are patent with the left
being minimally larger than the right. Left PICA origin is patent.
The right PICA origin was not imaged. SCA origins are patent.
Basilar artery is widely patent. Posterior communicating arteries
are not identified. PCAs are patent without evidence of significant
proximal stenosis.

The internal carotid arteries are patent from skullbase to carotid
termini. Focal areas diminished signal the in the proximal right
petrous ICA is favored to be artifactual, with less prominent
artifact on the contralateral side at the same level. The ACAs and
MCAs are patent without evidence of major branch occlusion or
significant proximal stenosis. No intracranial aneurysm is
identified.
IMPRESSION: 1. Small acute infarcts in the pons and left cingulate gyrus.
2. Moderately advanced cerebral white matter disease for age,
nonspecific. Considerations include chronic small vessel ischemia,
neurosarcoidosis, other vasculitis, and demyelination. No evidence
of dural or leptomeningeal sarcoid involvement.
3. No evidence of major branch occlusion or significant proximal
stenosis.

## 2017-06-19 ENCOUNTER — Encounter: Payer: Medicare Other | Admitting: Physical Medicine & Rehabilitation

## 2017-06-24 ENCOUNTER — Ambulatory Visit: Payer: Medicare Other | Admitting: Physical Medicine & Rehabilitation

## 2017-07-09 ENCOUNTER — Encounter: Payer: Self-pay | Admitting: Physical Medicine & Rehabilitation

## 2017-07-09 ENCOUNTER — Encounter: Payer: Medicare Other | Attending: Physical Medicine & Rehabilitation | Admitting: Physical Medicine & Rehabilitation

## 2017-07-09 VITALS — BP 125/84 | HR 86 | Resp 14

## 2017-07-09 DIAGNOSIS — Z5189 Encounter for other specified aftercare: Secondary | ICD-10-CM | POA: Insufficient documentation

## 2017-07-09 DIAGNOSIS — I69354 Hemiplegia and hemiparesis following cerebral infarction affecting left non-dominant side: Secondary | ICD-10-CM | POA: Insufficient documentation

## 2017-07-09 DIAGNOSIS — M7062 Trochanteric bursitis, left hip: Secondary | ICD-10-CM | POA: Diagnosis not present

## 2017-07-09 DIAGNOSIS — M797 Fibromyalgia: Secondary | ICD-10-CM | POA: Diagnosis not present

## 2017-07-09 DIAGNOSIS — E669 Obesity, unspecified: Secondary | ICD-10-CM | POA: Diagnosis not present

## 2017-07-09 DIAGNOSIS — I10 Essential (primary) hypertension: Secondary | ICD-10-CM | POA: Insufficient documentation

## 2017-07-09 DIAGNOSIS — G894 Chronic pain syndrome: Secondary | ICD-10-CM

## 2017-07-09 DIAGNOSIS — E785 Hyperlipidemia, unspecified: Secondary | ICD-10-CM | POA: Diagnosis not present

## 2017-07-09 NOTE — Progress Notes (Signed)
Subjective:    Patient ID: Sharon Roberson, female    DOB: 1965-12-16, 51 y.o.   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   Last clinic visit 05/13/17. At that time, she had left trochanteric bursa injection with anesthetic only.  She states it lasted ~5 weeks. She is now unsure if she will be moving to [place]. She states she finally received a call from dietitian, but could not go due to family issues.   Pain Inventory Average Pain 7 Pain Right Now 3 My pain is sharp and stabbing  In the last 24 hours, has pain interfered with the following? General activity 4 Relation with others 4 Enjoyment of life 4 What TIME of day is your pain at its worst? evening Sleep (in general) Fair  Pain is worse with: walking, sitting and some activites Pain improves with: rest and injections Relief from Meds: 3  Mobility walk without assistance Do you have any goals in this area?  no  Function disabled: date disabled . Do you have any goals in this area?  no  Neuro/Psych No problems in this area trouble walking depression  Prior Studies Any changes since last visit?  no  Physicians involved in your care Any changes since last visit?  no   Family History  Problem Relation Age of Onset  . Kidney cancer Mother        died 53yr  (had kidney transplant)  . Lung cancer Mother   . Stroke Father        died 62y  Gallstones  . Hypertension Sister   . Thyroid disease Sister   . Bone cancer Brother        stem cell transplant  . Diabetes Brother   . Hypertension Brother   . Heart failure Sister   . Hypertension Sister    Social History   Social History  . Marital status: Significant Other    Spouse name: N/A  . Number of children: 1  . Years of education: college   Occupational History  .  Disability     Social History Main Topics  . Smoking status: Former Smoker    Packs/day: 0.50    Types: Cigarettes    Quit date: 04/23/2016  . Smokeless tobacco: Never Used  . Alcohol use Yes     Comment: occasional  . Drug use: Yes    Types: Marijuana     Comment: marijuana: occasional  . Sexual activity: Not Asked   Other Topics Concern  . None   Social History Narrative   Right handed.   Caffeine some (soda every now and then)   Living home. One child (27 yr)   Disabled.     Past Surgical History:  Procedure Laterality Date  . KIDNEY STONE SURGERY     1999,2000,2001,2002,2003,2004  . LUNG SURGERY  2003  . THYROID SURGERY     Past Medical History:  Diagnosis Date  . Eyelid cyst, right 07/17/2016  . Hypercholesteremia   . Hypertension   . Pneumothorax   . Sarcoidosis   . Stroke (HCC)    BP 125/84 (BP Location: Right Arm, Patient Position: Sitting, Cuff Size: Normal)   Pulse 86   Resp 14   SpO2 96%   Opioid Risk Score:   Fall Risk Score:  `1  Depression screen PHQ 2/9  No flowsheet  data found.  Review of Systems  Constitutional: Positive for diaphoresis and unexpected weight change.  HENT: Negative.   Eyes: Negative.   Respiratory: Negative.   Cardiovascular: Negative.   Gastrointestinal: Positive for constipation.  Endocrine: Negative.        High blood sugar  Genitourinary: Negative.   Musculoskeletal: Positive for arthralgias, back pain, gait problem and joint swelling.  Skin: Negative.   Allergic/Immunologic: Negative.   Neurological:       Spasms  Hematological: Negative.   Psychiatric/Behavioral: Positive for dysphoric mood.  All other systems reviewed and are negative.     Objective:   Physical Exam  Gen NAD. Vital signs reviewed. HEENT: normocephalic, atraumatic Cardio: RRR. No JVD. Resp: CTA B/L and unlabored GI: BS positive and NT/ND Musc/Skel:  No edema.   +TTP left greater trochanter. Neuro: Alert and Oriented Motor:  RUE:  5/5 proximal to distal RLE: 5/5 proximal to distal  LUE/LLE: 5/5 throughout  No increase in tone noted Skin:  Intact. Warm and dry. Psych: Normal mood. Normal affect.    Assessment & Plan:  51 year old right-handed female, history of hypertension, hyperlipidemia, tobacco abuse presents for follow up for left greater trochanteric bursitis with hx of CVA.   1. 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 Obesity  Dietitian referral (encouraged pt follow up, 4th time)- now states she was scheduled for an appointment, but not able to make it             Encouraged activity and weight loss.             Discouraged use of "diet pill" again  5. Fibromyalgia             Cont Cymbalta             Pt appears stable at present

## 2017-07-23 ENCOUNTER — Encounter: Payer: Self-pay | Admitting: Physical Medicine & Rehabilitation

## 2017-07-23 ENCOUNTER — Encounter (HOSPITAL_BASED_OUTPATIENT_CLINIC_OR_DEPARTMENT_OTHER): Payer: Medicare Other | Admitting: Physical Medicine & Rehabilitation

## 2017-07-23 VITALS — BP 119/85 | HR 84

## 2017-07-23 DIAGNOSIS — I69354 Hemiplegia and hemiparesis following cerebral infarction affecting left non-dominant side: Secondary | ICD-10-CM | POA: Diagnosis not present

## 2017-07-23 DIAGNOSIS — M7062 Trochanteric bursitis, left hip: Secondary | ICD-10-CM | POA: Diagnosis not present

## 2017-07-23 NOTE — Addendum Note (Signed)
Addended by: Maryla MorrowPATEL, Edwyna Dangerfield A on: 07/23/2017 12:08 PM   Modules accepted: Orders

## 2017-07-23 NOTE — Progress Notes (Addendum)
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  Informed consent was obtained after describing risks and benefits of the procedure with the patient, this includes bleeding, bruising, infection and medication side effects. The patient wishes to proceed and has given written consent. Patient was placed in a seated position. The left greater trochanteric area was marked and prepped with alcohol at the point of maximal tenderness. Vapocoolant spray was sprayed.  A 22-gauge 2 inch needle was inserted into the left greater trochanteric area until bone was felt.  Needle was drawn back and after negative draw back for blood, a 4cc solution containing 3cc of 2% sensorcaine and 1cc of 6mg /ml Celestone was injected. A band aid was applied. The patient tolerated the procedure well. Post procedure instructions were given.  Plan: Will refer to PT

## 2017-08-05 ENCOUNTER — Ambulatory Visit: Payer: Medicare Other | Attending: Pediatrics | Admitting: Physical Therapy

## 2017-08-05 DIAGNOSIS — R252 Cramp and spasm: Secondary | ICD-10-CM | POA: Diagnosis present

## 2017-08-05 DIAGNOSIS — R262 Difficulty in walking, not elsewhere classified: Secondary | ICD-10-CM | POA: Diagnosis present

## 2017-08-05 DIAGNOSIS — M6281 Muscle weakness (generalized): Secondary | ICD-10-CM | POA: Diagnosis present

## 2017-08-05 DIAGNOSIS — M25552 Pain in left hip: Secondary | ICD-10-CM | POA: Insufficient documentation

## 2017-08-05 NOTE — Therapy (Addendum)
[facility] [place] [address]  [address] [place], [place], [address] Phone: [phone]   Fax:  [phone]  Physical Therapy Evaluation  Patient Details  Name: Rushie Nyhannez Degraaf MRN: [id number] Date of Birth: 07/29/1966 Referring Provider: Maryla MorrowAnkit Patel, MD  Encounter Date: 08/05/2017      PT End of Session - 08/05/17 0915    Visit Number 1   Number of Visits 16   Date for PT Re-Evaluation 10/02/17   Authorization Type Blue Medicare & Medicaid   PT Start Time 0915   PT Stop Time 1025   PT Time Calculation (min) 70 min   Activity Tolerance Patient tolerated treatment well   Behavior During Therapy [facility] for tasks assessed/performed      Past Medical History:  Diagnosis Date  . Eyelid cyst, right 07/17/2016  . Hypercholesteremia   . Hypertension   . Pneumothorax   . Sarcoidosis   . Stroke [facility](HCC)     Past Surgical History:  Procedure Laterality Date  . KIDNEY STONE SURGERY     1999,2000,2001,2002,2003,2004  . LUNG SURGERY  2003  . THYROID SURGERY      There were no vitals filed for this visit.       Subjective Assessment - 08/05/17 0920    Subjective Pt reports h/o CVA in July 2017 - 2 wk acute rehab followed by 2 wks inpt rehab and HHPT. Has been having ongoing B hip pain since CVA, with L hip attributed to bursitis. Has had ~4 injections in the L hip with average releif from injections lasting 3 weeks.   Limitations Walking;Standing;Sitting   How long can you sit comfortably? 1-2 hrs   How long can you stand comfortably? 1 hr   How long can you walk comfortably? 1 block   Patient Stated Goals "to be able to walk around the block w/o pain"   Currently in Pain? Yes   Pain Score 6    Pain Location Hip   Pain Orientation Left   Pain Descriptors / Indicators Throbbing;Aching   Pain Type Chronic pain   Pain Radiating Towards down anterior L leg to knee   Pain Onset More than a month ago   Pain Frequency Intermittent    Aggravating Factors  worst upon rising from bed in morning, prolonged walking, prolonged sitting or standing   Pain Relieving Factors heating pad   Effect of Pain on Daily Activities not able walk as much as she would like            [facility] PT Assessment - 08/05/17 0915      Assessment   Medical Diagnosis L greater trochanteric bursitis   Referring Provider Maryla MorrowAnkit Patel, MD   Onset Date/Surgical Date --  July 2017   Next MD Visit 09/03/17     Balance Screen   Has the patient fallen in the past 6 months No   Has the patient had a decrease in activity level because of a fear of falling?  No   Is the patient reluctant to leave their home because of a fear of falling?  No     Home Environment   Living Environment Private residence   Living Arrangements Spouse/significant other   Type of Home House   Home Access Stairs to enter   Entrance Stairs-Number of Steps 4 or 6   Entrance Stairs-Rails Right;Left;Can reach both   Home Layout One level   Home Equipment Shower seat;Grab bars - tub/shower  Prior Function   Level of Independence Independent   Vocation On disability   Leisure reading; used to walk everyday, but not since CVA     ROM / Strength   AROM / PROM / Strength Strength     Strength   Strength Assessment Site Hip;Knee;Ankle   Right/Left Hip Right;Left   Right Hip Flexion 3+/5   Right Hip Extension 4-/5   Right Hip External Rotation  3+/5   Right Hip Internal Rotation 3/5   Right Hip ABduction 3+/5   Right Hip ADduction 3/5   Left Hip Flexion 4-/5   Left Hip Extension 4-/5   Left Hip External Rotation 4-/5   Left Hip Internal Rotation 3/5   Left Hip ABduction 3+/5   Left Hip ADduction 3/5   Right/Left Knee Right;Left   Right Knee Flexion 4-/5   Right Knee Extension 3+/5   Left Knee Flexion 3+/5   Left Knee Extension 3+/5   Right/Left Ankle Right;Left   Right Ankle Dorsiflexion 4-/5   Right Ankle Plantar Flexion 3/5   Left Ankle Dorsiflexion 4-/5    Left Ankle Plantar Flexion 3-/5     Flexibility   Soft Tissue Assessment /Muscle Length yes   Hamstrings mod tight L>R   Quadriceps mod-severe tight L>R   ITB severe tight L, mod tight R   Piriformis severe tight L, mod tight R     Palpation   Palpation comment ttp over L greater trochanter, L ITB, L piriformis & R lateral hip            Objective measurements completed on examination: See above findings.                  PT Education - 08/05/17 1015    Education provided Yes   Education Details PT eval findings, anticipated POC & initial HEP   Person(s) Educated Patient   Methods Explanation;Demonstration;Handout;Verbal cues;Tactile cues   Comprehension Verbalized understanding;Returned demonstration;Verbal cues required;Tactile cues required;Need further instruction          PT Short Term Goals - 08/05/17 1405      PT SHORT TERM GOAL #1   Title Independent with initial HEP   Status New   Target Date 08/26/17           PT Long Term Goals - 08/05/17 1405      PT LONG TERM GOAL #1   Title Independent with ongoing HEP   Status New   Target Date 10/02/17     PT LONG TERM GOAL #2   Title B LE strength >/= 4/5 for improved stability and activity tolerance    Status New   Target Date 10/02/17     PT LONG TERM GOAL #3   Title Pt will report ability to walk around the block within her neighborhood w/o limitation d/t L hip pain   Status New   Target Date 10/02/17                Plan - 08/05/17 1015    Clinical Impression Statement Talya is a 51 y/o female referred to OP PT for L hip greater trochanteric bursitis of > 1 year duration. Pt reports B hip pain (L>R) since CVA in July 2017 with R hemiparesis, which prevents her from walking long distances and keeps her from exercising to lose the weight she is supposed to. Pt ttp over L greater trochanter, L ITB, L piriformis & R lateral hip. Pt demonstrates significantly decreased flexibility in  LEs,  L>R and mild to moderate weakness throughout B LEs. Pt demonstrates good potential to benefit from skilled PT with POC to focus on normalization of muscle tension and flexibility, core/proximal stability, LE strengthening, gait training to normalize gait pattern, and modalities including estim/TENS or iontophoresis PRN for pain. Pt reporting positive response to initial use of IFC estim for pain, therefore will look into insurance coverage for home TENS/IT unit.   History and Personal Factors relevant to plan of care: L pontine CVA with R hemiparesis - July 2017; neuropathy; HTN; fibromyalgia   Clinical Presentation Evolving   Clinical Presentation due to: recurrent L hip pain becming less responsive to injections; complex medical history as above   Clinical Decision Making Moderate   Rehab Potential Good   Clinical Impairments Affecting Rehab Potential L pontine CVA with R hemiparesis - July 2017; neuropathy; HTN; fibromyalgia   PT Frequency 2x / week   PT Duration 8 weeks   PT Treatment/Interventions Patient/family education;ADLs/Self Care Home Management;Moist Heat;Electrical Stimulation;Cryotherapy;Iontophoresis 4mg /ml Dexamethasone;Manual techniques;Dry needling;Taping;Passive range of motion;Therapeutic exercise;Therapeutic activities;Functional mobility training;Gait training;Balance training;Neuromuscular re-education   Consulted and Agree with Plan of Care Patient      Patient will benefit from skilled therapeutic intervention in order to improve the following deficits and impairments:  Pain, Impaired flexibility, Increased muscle spasms, Decreased range of motion, Decreased strength, Postural dysfunction, Improper body mechanics, Decreased activity tolerance, Difficulty walking  Visit Diagnosis: Pain in left hip - Plan: PT plan of care cert/re-cert  Difficulty in walking, not elsewhere classified - Plan: PT plan of care cert/re-cert  Muscle weakness (generalized) - Plan: PT plan  of care cert/re-cert  Cramp and spasm - Plan: PT plan of care cert/re-cert      G-Codes - August 23, 2017 1015    Functional Assessment Tool Used (Outpatient Only) Clinical judgment   Functional Limitation Mobility: Walking and moving around   Mobility: Walking and Moving Around Current Status (W0981) At least 40 percent but less than 60 percent impaired, limited or restricted   Mobility: Walking and Moving Around Goal Status (X9147) At least 20 percent but less than 40 percent impaired, limited or restricted       Problem List Patient Active Problem List   Diagnosis Date Noted  . Somnolence, daytime 06/19/2016  . Left pontine CVA (HCC) 04/25/2016  . Acute right-sided weakness 04/23/2016  . CVA (cerebral infarction) 04/23/2016  . Benign essential HTN 04/23/2016  . Neuropathy 04/23/2016  . Dyslipidemia 04/23/2016  . Right sided weakness     Marry Guan, PT, MPT 2017/08/23, 2:14 PM  [facility] [address]  [address] [place], [place], [address] Phone: [phone]   Fax:  [phone]  Name: Leanora Murin MRN: [id number] Date of Birth: [birth year]

## 2017-08-11 ENCOUNTER — Ambulatory Visit: Payer: Medicare Other | Attending: Pediatrics

## 2017-08-11 DIAGNOSIS — M6281 Muscle weakness (generalized): Secondary | ICD-10-CM | POA: Insufficient documentation

## 2017-08-11 DIAGNOSIS — R252 Cramp and spasm: Secondary | ICD-10-CM | POA: Diagnosis present

## 2017-08-11 DIAGNOSIS — R262 Difficulty in walking, not elsewhere classified: Secondary | ICD-10-CM | POA: Diagnosis present

## 2017-08-11 DIAGNOSIS — M25552 Pain in left hip: Secondary | ICD-10-CM

## 2017-08-11 NOTE — Therapy (Signed)
[facility] [place] [address]  [address] [place], [place], [address] Phone: [phone]   Fax:  [phone]  Physical Therapy Treatment  Patient Details  Name: Sharon Roberson MRN: [id number] Date of Birth: 03/31/1966 Referring Provider: Maryla MorrowAnkit Patel, MD   Encounter Date: 08/11/2017  PT End of Session - 08/11/17 0938    Visit Number  2    Number of Visits  16    Date for PT Re-Evaluation  10/02/17    Authorization Type  Blue Medicare & Medicaid    PT Start Time  0930    PT Stop Time  1025    PT Time Calculation (min)  55 min    Activity Tolerance  Patient tolerated treatment well    Behavior During Therapy  [facility] for tasks assessed/performed       Past Medical History:  Diagnosis Date  . Eyelid cyst, right 07/17/2016  . Hypercholesteremia   . Hypertension   . Pneumothorax   . Sarcoidosis   . Stroke [facility](HCC)     Past Surgical History:  Procedure Laterality Date  . KIDNEY STONE SURGERY     1999,2000,2001,2002,2003,2004  . LUNG SURGERY  2003  . THYROID SURGERY      There were no vitals filed for this visit.  Subjective Assessment - 08/11/17 0933    Subjective  Pt. reporting she went out of town over weekend and was very active and the hip has been hurting worse since then.      Patient Stated Goals  "to be able to walk around the block w/o pain"    Currently in Pain?  Yes    Pain Score  8     Pain Location  Hip    Pain Orientation  Left    Pain Descriptors / Indicators  Throbbing;Aching    Pain Type  Chronic pain    Pain Radiating Towards  none today    Pain Onset  More than a month ago    Pain Frequency  Intermittent    Aggravating Factors   worse up rising from bed in morning    Multiple Pain Sites  No                      [facility] Adult PT Treatment/Exercise - 08/11/17 0941      Self-Care   Self-Care  Other Self-Care Comments    Other Self-Care Comments   Instruction on self-massage with rolling pin  to L lateral thigh musculature      Exercises   Exercises  Knee/Hip      Knee/Hip Exercises: Stretches   Passive Hamstring Stretch  Left;30 seconds    Passive Hamstring Stretch Limitations  with strap    Hip Flexor Stretch  Left;1 rep;60 seconds    Hip Flexor Stretch Limitations  in modified thomas pos with strap    ITB Stretch  Left;30 seconds;2 reps required cueing for proper technique    required cueing for proper technique    ITB Stretch Limitations  supine with strap    Piriformis Stretch  Left;30 seconds required cueing for positioning    required cueing for positioning    Piriformis Stretch Limitations  Figure-4 with strap;     Other Knee/Hip Stretches  --      Knee/Hip Exercises: Aerobic   Nustep  NuStep: lvl 4, 6 min       Knee/Hip Exercises: Supine   Bridges with Clamshell  Both;10 reps with sustained hip  abd/ER isometrics into yellow TB   with sustained hip abd/ER isometrics into yellow TB   Other Supine Knee/Hip Exercises  Hooklying alternating hip abd/ER with yellow TB at knees x 10 reps each       Knee/Hip Exercises: Sidelying   Clams  R sidelying L clam shell with resistance x 10 reps       Modalities   Modalities  Iontophoresis;Geologist, engineeringlectrical Stimulation      Electrical Stimulation   Electrical Stimulation Location  L hip     Electrical Stimulation Action  IFC    Electrical Stimulation Parameters  intensity to pt. tolerance, 10'     Electrical Stimulation Goals  Pain      Iontophoresis   Type of Iontophoresis  Dexamethasone    Location  L greater trochanter    Dose  1.520ml, 8280mA-min     Time  4-6 hr wear time      Manual Therapy   Manual Therapy  Soft tissue mobilization    Manual therapy comments  R sidelying with L LE resting on bolster     Soft tissue mobilization  STM to L VL/ITB with roller stick; pt. reporting tenderness throughout              PT Education - 08/11/17 1423    Education provided  Yes    Education Details  Iontophoresis  education handout including contraindications/precuations and instruction on wear time; self-seated rolling pin to L lateral thigh musculature to decrease tenderness and improve tissue quality    Person(s) Educated  Patient    Methods  Explanation;Verbal cues;Handout    Comprehension  Verbalized understanding;Verbal cues required;Need further instruction       PT Short Term Goals - 08/11/17 1424      PT SHORT TERM GOAL #1   Title  Independent with initial HEP    Status  On-going        PT Long Term Goals - 08/11/17 1424      PT LONG TERM GOAL #1   Title  Independent with ongoing HEP    Status  On-going      PT LONG TERM GOAL #2   Title  B LE strength >/= 4/5 for improved stability and activity tolerance     Status  On-going      PT LONG TERM GOAL #3   Title  Pt will report ability to walk around the block within her neighborhood w/o limitation d/t L hip pain    Status  On-going            Plan - 08/11/17 1424    Clinical Impression Statement  [name] reporting increased L hip pain today attributing this to being out of town over weekend and very active.  Admitting to limited compliance with HEP and requiring frequent cueing for proper positioning and technique today with review.  Initiated gentle hip strengthening today which was tolerated well.  Pt. tender throughout L lateral thigh musculature with STM today and would benefit from further work in this area.  MD signing iontophoresis order thus educational handout issued to pt. with pt. verbalizing understanding of precautions/contraindications and wear-time.  Ionto patch #1/6 initiated today for hopeful reduction in pain and swelling at L GT.    Clinical Impairments Affecting Rehab Potential  L pontine CVA with R hemiparesis - July 2017; neuropathy; HTN; fibromyalgia    PT Treatment/Interventions  Patient/family education;ADLs/Self Care Home Management;Moist Heat;Electrical Stimulation;Cryotherapy;Iontophoresis 4mg /ml  Dexamethasone;Manual techniques;Dry needling;Taping;Passive range of motion;Therapeutic exercise;Therapeutic activities;Functional mobility  training;Gait training;Balance training;Neuromuscular re-education       Patient will benefit from skilled therapeutic intervention in order to improve the following deficits and impairments:  Pain, Impaired flexibility, Increased muscle spasms, Decreased range of motion, Decreased strength, Postural dysfunction, Improper body mechanics, Decreased activity tolerance, Difficulty walking  Visit Diagnosis: Pain in left hip  Difficulty in walking, not elsewhere classified  Muscle weakness (generalized)  Cramp and spasm     Problem List Patient Active Problem List   Diagnosis Date Noted  . Somnolence, daytime 06/19/2016  . Left pontine CVA (HCC) 04/25/2016  . Acute right-sided weakness 04/23/2016  . CVA (cerebral infarction) 04/23/2016  . Benign essential HTN 04/23/2016  . Neuropathy 04/23/2016  . Dyslipidemia 04/23/2016  . Right sided weakness     Kermit Balo, PTA 08/11/17 2:36 PM  [facility] [address]  [address] [place], [place], [address] Phone: [phone]   Fax:  [phone]  Name: Chaunte Hornbeck MRN: [id number] Date of Birth: Jul 09, 1966

## 2017-08-11 NOTE — Patient Instructions (Addendum)

## 2017-08-14 ENCOUNTER — Ambulatory Visit: Payer: Medicare Other

## 2017-08-14 DIAGNOSIS — M25552 Pain in left hip: Secondary | ICD-10-CM | POA: Diagnosis not present

## 2017-08-14 DIAGNOSIS — R252 Cramp and spasm: Secondary | ICD-10-CM

## 2017-08-14 DIAGNOSIS — M6281 Muscle weakness (generalized): Secondary | ICD-10-CM

## 2017-08-14 DIAGNOSIS — R262 Difficulty in walking, not elsewhere classified: Secondary | ICD-10-CM

## 2017-08-14 NOTE — Therapy (Signed)
[facility] [place] [address]  [address] [place], [place], [address] Phone: [phone]   Fax:  [phone]  Physical Therapy Treatment  Patient Details  Name: Sharon Roberson MRN: [id number] Date of Birth: 12/07/1965 Referring Provider: Maryla MorrowAnkit Patel, MD   Encounter Date: 08/14/2017  PT End of Session - 08/14/17 0935    Visit Number  3    Number of Visits  16    Date for PT Re-Evaluation  10/02/17    Authorization Type  [employer] & Medicaid    PT Start Time  0927    PT Stop Time  1027    PT Time Calculation (min)  60 min    Activity Tolerance  Patient tolerated treatment well    Behavior During Therapy  [facility] for [facility] assessed/performed       Past Medical History:  Diagnosis Date  . Eyelid cyst, right 07/17/2016  . Hypercholesteremia   . Hypertension   . Pneumothorax   . Sarcoidosis   . Stroke [facility](HCC)     Past Surgical History:  Procedure Laterality Date  . KIDNEY STONE SURGERY     1999,2000,2001,2002,2003,2004  . LUNG SURGERY  2003  . THYROID SURGERY      There were no vitals filed for this visit.  Subjective Assessment - 08/14/17 0934    Subjective  Pt. reporting she has been performing roller for thigh muscles and stretching daily.  Reports relief from pain with rolling and felt relief from ionto patch x 1 day.      Patient Stated Goals  "to be able to walk around the block w/o pain"    Currently in Pain?  Yes    Pain Score  8     Pain Location  Hip    Pain Orientation  Left    Pain Descriptors / Indicators  Throbbing;Aching    Pain Type  Chronic pain    Pain Radiating Towards  into L lateral thigh musculature    Pain Onset  More than a month ago    Pain Frequency  Intermittent    Aggravating Factors   worse when standing    Pain Relieving Factors  heat pad     Multiple Pain Sites  No                      [facility] Adult PT Treatment/Exercise - 08/14/17 0940      Knee/Hip Exercises: Stretches    Hip Flexor Stretch  Left;1 rep;60 seconds    Hip Flexor Stretch Limitations  manual with therapist     ITB Stretch  Left;30 seconds;1 rep    ITB Stretch Limitations  sidelying manual with therapist     Piriformis Stretch  Left;30 seconds    Piriformis Stretch Limitations  manual with therapist       Knee/Hip Exercises: Aerobic   Nustep  NuStep: lvl 4, 6 min       Knee/Hip Exercises: Standing   Functional Squat  10 seconds;3 seconds;1 set good tolerance     Functional Squat Limitations  counter       Knee/Hip Exercises: Seated   Long Arc Quad  Right;Left;5 reps;Strengthening pt. requesting rest break thus terminated    Con-wayLong Arc Quad Limitations  with yellow TB at ankles    Hamstring Curl  Right;Left;10 reps;Strengthening    Hamstring Limitations  yellow TB      Knee/Hip Exercises: Supine   Hip Adduction Isometric  10 reps;Both  Hip Adduction Isometric Limitations  5" hold ball squeeze     Bridges with Clamshell  Both;10 reps with sustained hip isometric hip abd/ER with yellow at knees    Other Supine Knee/Hip Exercises  Hooklying alternating hip abd/ER with yellow TB at knees x 15 reps each     Other Supine Knee/Hip Exercises  Hooklying alternating march with yellow TB at knees x 10 reps       Modalities   Modalities  Iontophoresis;Geologist, engineering Location  L hip     Electrical Stimulation Action  IFC    Electrical Stimulation Parameters  intensity to pt. tolerance; 15'     Electrical Stimulation Goals  Pain      Iontophoresis   Type of Iontophoresis  Dexamethasone    Location  L greater trochanter    Dose  1.64ml, 68mA-min     Time  4-6 hr wear time      Manual Therapy   Manual Therapy  Soft tissue mobilization;Myofascial release    Manual therapy comments  R sidelying with L LE resting on bolster     Soft tissue mobilization  STM to L VL     Myofascial Release  TPR to L VL; pt. very tender throughout with  palpable trigger points              PT Education - 08/14/17 1023    Education provided  Yes    Education Details  bridge with yellow TB isometric hip abd/ER, sidelying clam shell without resistance, SLR, alteranting hip abd/ER with yellow TB issued to pt., adduction ball squeeze     Person(s) Educated  Patient    Methods  Explanation;Demonstration;Verbal cues;Handout    Comprehension  Verbalized understanding;Returned demonstration;Verbal cues required;Need further instruction       PT Short Term Goals - 08/11/17 1424      PT SHORT TERM GOAL #1   Title  Independent with initial HEP    Status  On-going        PT Long Term Goals - 08/11/17 1424      PT LONG TERM GOAL #1   Title  Independent with ongoing HEP    Status  On-going      PT LONG TERM GOAL #2   Title  B LE strength >/= 4/5 for improved stability and activity tolerance     Status  On-going      PT LONG TERM GOAL #3   Title  Pt will report ability to walk around the block within her neighborhood w/o limitation d/t L hip pain    Status  On-going            Plan - 08/14/17 1029    Clinical Impression Statement  Tavi reporting relief from iontophoresis patch applied last visit and feels relief with HEP, rolling pin massage to L thigh muscles.  Still has not received approval from insurance for TENS/IT unit.  Initiated supine/seated level LE strengthening and added mini squat.  Pt. tolerated all activities well in treatment today.  Palpable TP's throughout L VL with manual STM/TPR today.  Pt. may benefit from DN in future.  Pt. reporting she is performing initial HEP stretches daily.  Treatment ending with E-stim and ionto patch #2/6 to L hip as pt. still ttp over L greater trochanter and demonstrating pain and tension in L lateral thigh musculature.    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; neuropathy; HTN; fibromyalgia    PT Treatment/Interventions   Patient/family education;ADLs/Self Care Home Management;Moist Heat;Electrical Stimulation;Cryotherapy;Iontophoresis 4mg /ml Dexamethasone;Manual techniques;Dry needling;Taping;Passive range of motion;Therapeutic exercise;Therapeutic activities;Functional mobility training;Gait training;Balance training;Neuromuscular re-education       Patient will benefit from skilled therapeutic intervention in order to improve the following deficits and impairments:  Pain, Impaired flexibility, Increased muscle spasms, Decreased range of motion, Decreased strength, Postural dysfunction, Improper body mechanics, Decreased activity tolerance, Difficulty walking  Visit Diagnosis: Pain in left hip  Difficulty in walking, not elsewhere classified  Muscle weakness (generalized)  Cramp and spasm     Problem List Patient Active Problem List   Diagnosis Date Noted  . Somnolence, daytime 06/19/2016  . Left pontine CVA (HCC) 04/25/2016  . Acute right-sided weakness 04/23/2016  . CVA (cerebral infarction) 04/23/2016  . Benign essential HTN 04/23/2016  . Neuropathy 04/23/2016  . Dyslipidemia 04/23/2016  . Right sided weakness     Kermit BaloMicah Tomasa Dobransky, PTA 08/14/17 10:48 AM  [facility] [place] [address]  [address] [place], [place], [address] Phone: [phone]   Fax:  [phone]  Name: Sharon Roberson MRN: [id number] Date of Birth: 09/20/1966

## 2017-08-18 ENCOUNTER — Encounter: Payer: Self-pay | Admitting: Physical Therapy

## 2017-08-18 ENCOUNTER — Ambulatory Visit: Payer: Medicare Other | Admitting: Physical Therapy

## 2017-08-18 DIAGNOSIS — M25552 Pain in left hip: Secondary | ICD-10-CM | POA: Diagnosis not present

## 2017-08-18 DIAGNOSIS — M6281 Muscle weakness (generalized): Secondary | ICD-10-CM

## 2017-08-18 DIAGNOSIS — R252 Cramp and spasm: Secondary | ICD-10-CM

## 2017-08-18 DIAGNOSIS — R262 Difficulty in walking, not elsewhere classified: Secondary | ICD-10-CM

## 2017-08-18 NOTE — Therapy (Addendum)
[facility] [address]  [place], [place], [address] Phone: [phone]   Fax:  [phone]  Physical Therapy Treatment  Patient Details  Name: Sharon Roberson MRN: [id number] Date of Birth: July 02, 1966 Referring Provider: Delice Lesch, MD   Encounter Date: 08/18/2017  PT End of Session - 08/18/17 0931    Visit Number  4    Number of Visits  16    Date for PT Re-Evaluation  10/02/17    Authorization Type  [employer] & [employer]    PT Start Time  [phone]    PT Stop Time  1022    PT Time Calculation (min)  51 min    Activity Tolerance  Patient tolerated treatment well    Behavior During Therapy  [facility] for tasks assessed/performed       Past Medical History:  Diagnosis Date  . Eyelid cyst, right 07/17/2016  . Hypercholesteremia   . Hypertension   . Pneumothorax   . Sarcoidosis   . Stroke [facility])     Past Surgical History:  Procedure Laterality Date  . KIDNEY STONE SURGERY     1999,2000,2001,2002,2003,2004  . LUNG SURGERY  2003  . THYROID SURGERY      There were no vitals filed for this visit.  Subjective Assessment - 08/18/17 0935    Subjective  Pt reporting pain is a little better but always bad in the mornings.    Patient Stated Goals  "to be able to walk around the block w/o pain"    Currently in Pain?  Yes    Pain Score  7     Pain Location  Trochanter    Pain Orientation  Left    Pain Descriptors / Indicators  Throbbing    Pain Type  Chronic pain                      [facility] Adult PT Treatment/Exercise - 08/18/17 0931      Exercises   Exercises  Knee/Hip      Knee/Hip Exercises: Aerobic   Nustep  lvl 5 x 4', reduced to lvl 4 x last 2' (LE only)      Knee/Hip Exercises: Standing   Heel Raises  Both;15 reps;3 seconds    Heel Raises Limitations  UE support on counter    Hip Flexion  Both;10 reps;Knee bent    Hip Flexion Limitations  marching with counter for support    Hip  Abduction  Both;10 reps;Knee straight    Abduction Limitations  UE support on counter; cues to avoid LE ER & lateral trunk lean    Hip Extension  Both;10 reps;Knee straight    Extension Limitations  UE support on counter    Functional Squat  15 reps;5 seconds 3-5" hold    Functional Squat Limitations  counter minisquat - chair with [employer] cushion for target to [employer] proper technique      Knee/Hip Exercises: Supine   Bridges with Clamshell  Both;10 reps;2 sets + hip ABD isometric with yellow TB x10, red TB x10    Straight Leg Raises  Both;10 reps    Straight Leg Raises Limitations  3-5" hold    Other Supine Knee/Hip Exercises  Hooklying alternating hip abd/ER with TB at knees - yellow x10, red x10      Knee/Hip Exercises: Sidelying   Clams  B clam with yellow TB x10      Modalities   Modalities  Iontophoresis  Iontophoresis   Type of Iontophoresis  Dexamethasone    Location  L greater trochanter    Dose  1.0 ml, 80 mA-min     Time  4-6 hr patch (#3 of 6)      Manual Therapy   Manual Therapy  Taping    Kinesiotex  Create Space      Kinesiotix   Create Space  L ITB - 30% from distal to ITB insertion to just proximal to ionto patch, 50% perpedndicular strip over area of greatest tenderness             PT Education - 08/18/17 1015    Education provided  Yes    Education Details  HEP update - current theraband exercises progressed to red TB, yellow TB added to sidelying clam    Person(s) Educated  Patient    Methods  Explanation;Demonstration    Comprehension  Verbalized understanding;Returned demonstration       PT Short Term Goals - 08/11/17 1424      PT SHORT TERM GOAL #1   Title  Independent with initial HEP    Status  On-going        PT Long Term Goals - 08/11/17 1424      PT LONG TERM GOAL #1   Title  Independent with ongoing HEP    Status  On-going      PT LONG TERM GOAL #2   Title  B LE strength >/= 4/5 for improved stability and activity  tolerance     Status  On-going      PT LONG TERM GOAL #3   Title  Pt will report ability to walk around the block within her neighborhood w/o limitation d/t L hip pain    Status  On-going            Plan - 08/18/17 0937    Clinical Impression Statement  Pt reporting limited tolerance for rolling pin STM, stating she has been having a friend "roll" her leg for her - suggested pt either roll herself adjusting pressure as needed or provide feedback to her friend to adjust pressure. Also suggested that pt wrap rolling pin in towel to provide slight padding for increased comfort/tolerance. Pt able to tolerate progression of resistance with HEP exercises, Standing exercises introduced with some fatigue noted, and pain tending to creep up with fatigue, therefore no new exercises added to HEP. 3rd ionto patch applied to L greater trochanter as pt noting lasting relief for remainder of day following prior patches. Intiated trial of kinesiotaping for L ITB as pt continues to have increased tension and tenderness along ITB. If benefit noted, may extend taping all the way to greater trochanter with star over trochanter on future visits.    Rehab Potential  Good    Clinical Impairments Affecting Rehab Potential  L pontine CVA with R hemiparesis - July 2017; neuropathy; HTN; fibromyalgia    PT Treatment/Interventions  Patient/family education;ADLs/Self Care Home Management;Moist Heat;Electrical Stimulation;Cryotherapy;Iontophoresis 27m/ml Dexamethasone;Manual techniques;Dry needling;Taping;Passive range of motion;Therapeutic exercise;Therapeutic activities;Functional mobility training;Gait training;Balance training;Neuromuscular re-education    Consulted and Agree with Plan of Care  Patient       Patient will benefit from skilled therapeutic intervention in order to improve the following deficits and impairments:  Pain, Impaired flexibility, Increased muscle spasms, Decreased range of motion, Decreased  strength, Postural dysfunction, Improper body mechanics, Decreased activity tolerance, Difficulty walking  Visit Diagnosis: Pain in left hip  Difficulty in walking, not elsewhere classified  Muscle weakness (  generalized)  Cramp and spasm     Problem List Patient Active Problem List   Diagnosis Date Noted  . Somnolence, daytime 06/19/2016  . Left pontine CVA ([place]) 04/25/2016  . Acute right-sided weakness 04/23/2016  . CVA (cerebral infarction) 04/23/2016  . Benign essential HTN 04/23/2016  . Neuropathy 04/23/2016  . Dyslipidemia 04/23/2016  . Right sided weakness     Percival Spanish, PT, MPT 08/18/2017, 3:08 PM  [facility] [address]  [place], [place], [address] Phone: [phone]   Fax:  [phone]  Name: Porcha Deblanc MRN: [id number] Date of Birth: 04-05-1966  PHYSICAL THERAPY DISCHARGE SUMMARY  Visits from Start of Care: 4  Current functional level related to goals / functional outcomes:   Refer to above clinical impression for status as of last visit on 08/18/17. Pt has cancelled all visits since this date due to illness and has not returned to PT in >30 days, therefore will proceed with discharge from PT for this episode.   Remaining deficits:   Unable to formally assess as pt failed to return for any furhter visits.   Education / Equipment:   HEP   G-Codes - 09/01/2017 1015    Functional Assessment Tool Used (Outpatient Only) Clinical judgment   Functional Limitation Mobility: Walking and moving around   Mobility: Walking and Moving Around Goal Status [phone]) At least 20 percent but less than 40 percent impaired, limited or restricted   Mobility: Walking and Moving Around Discharge Status [phone]) At least 40 percent but less than 60 percent impaired, limited or restricted    Plan: Patient agrees to discharge.  Patient goals were not met. Patient is being discharged due to not returning  since the last visit.  ?????    Percival Spanish, PT, MPT 09/22/17, 8:53 AM  [facility] [address]  [place], [place], [address] Phone: [phone]   Fax:  [phone]

## 2017-08-21 ENCOUNTER — Ambulatory Visit: Payer: Medicare Other | Admitting: Physical Therapy

## 2017-08-24 ENCOUNTER — Ambulatory Visit: Payer: Medicare Other | Admitting: Physical Therapy

## 2017-09-03 ENCOUNTER — Encounter: Payer: Medicare Other | Attending: Physical Medicine & Rehabilitation | Admitting: Physical Medicine & Rehabilitation

## 2017-09-03 DIAGNOSIS — Z5189 Encounter for other specified aftercare: Secondary | ICD-10-CM | POA: Insufficient documentation

## 2017-09-03 DIAGNOSIS — I69354 Hemiplegia and hemiparesis following cerebral infarction affecting left non-dominant side: Secondary | ICD-10-CM | POA: Insufficient documentation

## 2017-09-03 DIAGNOSIS — E785 Hyperlipidemia, unspecified: Secondary | ICD-10-CM | POA: Insufficient documentation

## 2017-09-03 DIAGNOSIS — M797 Fibromyalgia: Secondary | ICD-10-CM | POA: Insufficient documentation

## 2017-09-03 DIAGNOSIS — E669 Obesity, unspecified: Secondary | ICD-10-CM | POA: Insufficient documentation

## 2017-09-03 DIAGNOSIS — I10 Essential (primary) hypertension: Secondary | ICD-10-CM | POA: Insufficient documentation

## 2017-11-25 ENCOUNTER — Encounter: Payer: Medicare Other | Attending: Physical Medicine & Rehabilitation | Admitting: Physical Medicine & Rehabilitation

## 2017-11-25 ENCOUNTER — Encounter: Payer: Self-pay | Admitting: Physical Medicine & Rehabilitation

## 2017-11-25 VITALS — BP 119/82 | HR 90

## 2017-11-25 DIAGNOSIS — Z79899 Other long term (current) drug therapy: Secondary | ICD-10-CM | POA: Insufficient documentation

## 2017-11-25 DIAGNOSIS — E669 Obesity, unspecified: Secondary | ICD-10-CM | POA: Insufficient documentation

## 2017-11-25 DIAGNOSIS — Z808 Family history of malignant neoplasm of other organs or systems: Secondary | ICD-10-CM | POA: Diagnosis not present

## 2017-11-25 DIAGNOSIS — I69351 Hemiplegia and hemiparesis following cerebral infarction affecting right dominant side: Secondary | ICD-10-CM | POA: Insufficient documentation

## 2017-11-25 DIAGNOSIS — D869 Sarcoidosis, unspecified: Secondary | ICD-10-CM | POA: Diagnosis not present

## 2017-11-25 DIAGNOSIS — G894 Chronic pain syndrome: Secondary | ICD-10-CM | POA: Insufficient documentation

## 2017-11-25 DIAGNOSIS — E785 Hyperlipidemia, unspecified: Secondary | ICD-10-CM | POA: Diagnosis not present

## 2017-11-25 DIAGNOSIS — Z823 Family history of stroke: Secondary | ICD-10-CM | POA: Insufficient documentation

## 2017-11-25 DIAGNOSIS — Z8051 Family history of malignant neoplasm of kidney: Secondary | ICD-10-CM | POA: Diagnosis not present

## 2017-11-25 DIAGNOSIS — Z8249 Family history of ischemic heart disease and other diseases of the circulatory system: Secondary | ICD-10-CM | POA: Diagnosis not present

## 2017-11-25 DIAGNOSIS — Z801 Family history of malignant neoplasm of trachea, bronchus and lung: Secondary | ICD-10-CM | POA: Diagnosis not present

## 2017-11-25 DIAGNOSIS — M7062 Trochanteric bursitis, left hip: Secondary | ICD-10-CM

## 2017-11-25 DIAGNOSIS — M797 Fibromyalgia: Secondary | ICD-10-CM | POA: Diagnosis not present

## 2017-11-25 DIAGNOSIS — Z87891 Personal history of nicotine dependence: Secondary | ICD-10-CM | POA: Diagnosis not present

## 2017-11-25 DIAGNOSIS — I10 Essential (primary) hypertension: Secondary | ICD-10-CM | POA: Diagnosis not present

## 2017-11-25 DIAGNOSIS — Z8349 Family history of other endocrine, nutritional and metabolic diseases: Secondary | ICD-10-CM | POA: Diagnosis not present

## 2017-11-25 DIAGNOSIS — M7061 Trochanteric bursitis, right hip: Secondary | ICD-10-CM | POA: Diagnosis not present

## 2017-11-25 DIAGNOSIS — Z833 Family history of diabetes mellitus: Secondary | ICD-10-CM | POA: Diagnosis not present

## 2017-11-25 MED ORDER — DICLOFENAC POTASSIUM 50 MG PO TABS: 50.0000 mg | ORAL_TABLET | Freq: Three times a day (TID) | ORAL | 1 refills | Status: AC

## 2017-11-25 NOTE — Patient Instructions (Signed)
Please obtain Lidocaine patch

## 2017-11-25 NOTE — Progress Notes (Addendum)
Subjective:    Patient ID: Kadin Bera, female    DOB: 1966/03/15, 52 y.o.   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   Last clinic visit 07/23/17. At that time, she had a left greater trochanteric hip injection. Since that time, she states she had a good benefit with the injection.  She had a good benefit with PT as well. She was doing a lot, but now states her pain has returned.  She still has not seen the dietitian, says she never received a call.   Pain Inventory Average Pain 6 Pain Right Now 8 My pain is sharp and stabbing  In the last 24 hours, has pain interfered with the following? General activity 4 Relation with others 4 Enjoyment of life 4 What TIME of day is your pain at its worst? evening Sleep (in general) Fair  Pain is worse with: walking, sitting and some activites Pain improves with: rest and injections Relief from Meds: 3  Mobility walk without assistance Do you have any goals in this area?  no  Function disabled: date disabled . Do you have any goals in this area?  no  Neuro/Psych No problems in this area trouble walking depression  Prior Studies Any changes since last visit?  no  Physicians involved in your care Any changes since last visit?  no   Family History  Problem Relation Age of Onset  . Kidney cancer Mother        died 26yr  (had kidney transplant)  . Lung cancer Mother   . Stroke Father        died 62y  Gallstones  . Hypertension Sister   . Thyroid disease Sister   . Bone cancer Brother        stem cell transplant  . Diabetes Brother   . Hypertension Brother   . Heart failure Sister   . Hypertension Sister    Social History   Socioeconomic History  . Marital status: Significant Other    Spouse name: None  . Number of children: 1  .  Years of education: college  . Highest education level: None  Social Needs  . Financial resource strain: None  . Food insecurity - worry: None  . Food insecurity - inability: None  . Transportation needs - medical: None  . Transportation needs - non-medical: None  Occupational History  . Occupation: Disability   Tobacco Use  . Smoking status: Former Smoker    Packs/day: 0.50    Types: Cigarettes    Last attempt to quit: 04/23/2016    Years since quitting: 1.5  . Smokeless tobacco: Never Used  Substance and Sexual Activity  . Alcohol use: Yes    Comment: occasional  . Drug use: Yes    Types: Marijuana    Comment: marijuana: occasional  . Sexual activity: None  Other Topics Concern  . None  Social History Narrative   Right handed.   Caffeine some (soda every now and then)   Living home. One child (27 yr)   Disabled.     Past Surgical History:  Procedure Laterality Date  . KIDNEY STONE SURGERY     1999,2000,2001,2002,2003,2004  . LUNG SURGERY  2003  . THYROID SURGERY     Past Medical History:  Diagnosis Date  . Eyelid cyst, right 07/17/2016  . Hypercholesteremia   .  Hypertension   . Pneumothorax   . Sarcoidosis   . Stroke (HCC)    BP 119/82   Pulse 90   SpO2 98%   Opioid Risk Score:   Fall Risk Score:  `1  Depression screen PHQ 2/9  No flowsheet data found.  Review of Systems  Constitutional: Positive for diaphoresis and unexpected weight change.  HENT: Negative.   Eyes: Negative.   Respiratory: Positive for cough.   Cardiovascular: Negative.   Gastrointestinal: Positive for abdominal pain.  Endocrine: Negative.        High blood sugar  Genitourinary: Negative.   Musculoskeletal: Positive for arthralgias, back pain, gait problem and joint swelling.  Skin: Negative.   Allergic/Immunologic: Negative.   Neurological:       Spasms  Hematological: Negative.   Psychiatric/Behavioral: Positive for dysphoric mood.  All other systems reviewed and are  negative.     Objective:   Physical Exam Gen NAD. Vital signs reviewed. HEENT: normocephalic, atraumatic Cardio: RRR. No JVD. Resp: CTA B/L and Unlabored GI: BS+and ND Musc/Skel:  No edema.   +TTP left > right greater trochanter. Neuro: Alert and Oriented Motor:  RUE: 5/5 proximal to distal RLE: 5/5 proximal to distal  LUE/LLE: 5/5 throughout  No increase in tone noted Skin:  Intact. Warm and dry. Psych: Normal mood. Normal affect.    Assessment & Plan:  52 year old right-handed female, history of hypertension, hyperlipidemia, tobacco abuse presents for follow up for left greater trochanteric bursitis with hx of CVA.   1. 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  Cont HEP  Pt released from Neurology             Cont meds  2.Chronic Pain syndrome, greatest at left >right  greater trochanter:              D/ced Gabapentin 100 TID due to nausea             D/ced Mobic due lack of efficacy  Good benefit with PT  Good benefit with steroid injection 10/18, will schedule again  Cont Voltaren gel  Will order Diclofenac 75 BID and consider d/cing Voltaren gel  Will order TENs IT as pt had good benefits with PT  Encouraged OTC Lidoderm patch             Will consider Lyrica in future  Will consider Diclofenac in future  3. Obesity  Dietitian referral (encouraged pt follow up, 5th time)- now states she never received call back             Encouraged activity and weight loss.             Discouraged use of "diet pill" again  5. Fibromyalgia             Cont Cymbalta             Pt appears stable at present

## 2017-12-16 ENCOUNTER — Encounter: Payer: Medicare Other | Attending: Physical Medicine & Rehabilitation | Admitting: Physical Medicine & Rehabilitation

## 2017-12-16 ENCOUNTER — Encounter: Payer: Self-pay | Admitting: Physical Medicine & Rehabilitation

## 2017-12-16 VITALS — BP 135/89 | HR 86 | Resp 14

## 2017-12-16 DIAGNOSIS — Z833 Family history of diabetes mellitus: Secondary | ICD-10-CM | POA: Diagnosis not present

## 2017-12-16 DIAGNOSIS — E785 Hyperlipidemia, unspecified: Secondary | ICD-10-CM | POA: Diagnosis not present

## 2017-12-16 DIAGNOSIS — Z87891 Personal history of nicotine dependence: Secondary | ICD-10-CM | POA: Insufficient documentation

## 2017-12-16 DIAGNOSIS — M797 Fibromyalgia: Secondary | ICD-10-CM | POA: Diagnosis not present

## 2017-12-16 DIAGNOSIS — Z801 Family history of malignant neoplasm of trachea, bronchus and lung: Secondary | ICD-10-CM | POA: Diagnosis not present

## 2017-12-16 DIAGNOSIS — Z79899 Other long term (current) drug therapy: Secondary | ICD-10-CM | POA: Diagnosis not present

## 2017-12-16 DIAGNOSIS — I10 Essential (primary) hypertension: Secondary | ICD-10-CM | POA: Insufficient documentation

## 2017-12-16 DIAGNOSIS — G894 Chronic pain syndrome: Secondary | ICD-10-CM | POA: Insufficient documentation

## 2017-12-16 DIAGNOSIS — D869 Sarcoidosis, unspecified: Secondary | ICD-10-CM | POA: Diagnosis not present

## 2017-12-16 DIAGNOSIS — Z8051 Family history of malignant neoplasm of kidney: Secondary | ICD-10-CM | POA: Diagnosis not present

## 2017-12-16 DIAGNOSIS — Z8249 Family history of ischemic heart disease and other diseases of the circulatory system: Secondary | ICD-10-CM | POA: Insufficient documentation

## 2017-12-16 DIAGNOSIS — E669 Obesity, unspecified: Secondary | ICD-10-CM | POA: Diagnosis not present

## 2017-12-16 DIAGNOSIS — Z808 Family history of malignant neoplasm of other organs or systems: Secondary | ICD-10-CM | POA: Diagnosis not present

## 2017-12-16 DIAGNOSIS — M7062 Trochanteric bursitis, left hip: Secondary | ICD-10-CM

## 2017-12-16 DIAGNOSIS — Z823 Family history of stroke: Secondary | ICD-10-CM | POA: Diagnosis not present

## 2017-12-16 DIAGNOSIS — I69351 Hemiplegia and hemiparesis following cerebral infarction affecting right dominant side: Secondary | ICD-10-CM | POA: Diagnosis not present

## 2017-12-16 DIAGNOSIS — Z8349 Family history of other endocrine, nutritional and metabolic diseases: Secondary | ICD-10-CM | POA: Diagnosis not present

## 2017-12-16 DIAGNOSIS — M7061 Trochanteric bursitis, right hip: Secondary | ICD-10-CM

## 2017-12-16 NOTE — Progress Notes (Signed)
B/l greater trochanteric bursa injection  Indication: Trochanteric bursa syndrome not relieved by medication management and other conservative care.  Informed consent was obtained after describing risks and benefits of the procedure with the patient, this includes bleeding, bruising, infection and medication side effects. The patient wishes to proceed and has given written consent. Patient was placed in a seated position. The b/l greater trochanteric area was marked and prepped with betadine at the point of maximal tenderness. Vapocoolant spray applied. A 25-gauge 1-1/2 inch needle was inserted into the greater trochanteric area until bone was felt.  Needle was drawn back and after negative draw back for blood, a 4cc solution containing 1mL of 6 mg per ML celestone and 4.0 mL of 1% lidocaine was injected. A band aid was applied. The patient tolerated the procedure well. Post procedure instructions were given.

## 2017-12-30 ENCOUNTER — Encounter: Payer: Medicare Other | Admitting: Physical Medicine & Rehabilitation

## 2018-01-06 ENCOUNTER — Encounter: Payer: Medicare Other | Admitting: Physical Medicine & Rehabilitation

## 2018-01-20 ENCOUNTER — Encounter: Payer: Medicare Other | Attending: Physical Medicine & Rehabilitation | Admitting: Physical Medicine & Rehabilitation

## 2018-01-20 ENCOUNTER — Encounter: Payer: Self-pay | Admitting: Physical Medicine & Rehabilitation

## 2018-01-20 VITALS — BP 129/88 | HR 75 | Resp 14 | Ht 60.0 in | Wt 181.0 lb

## 2018-01-20 DIAGNOSIS — M797 Fibromyalgia: Secondary | ICD-10-CM

## 2018-01-20 DIAGNOSIS — Z79899 Other long term (current) drug therapy: Secondary | ICD-10-CM | POA: Insufficient documentation

## 2018-01-20 DIAGNOSIS — Z8349 Family history of other endocrine, nutritional and metabolic diseases: Secondary | ICD-10-CM | POA: Insufficient documentation

## 2018-01-20 DIAGNOSIS — G894 Chronic pain syndrome: Secondary | ICD-10-CM | POA: Diagnosis not present

## 2018-01-20 DIAGNOSIS — Z833 Family history of diabetes mellitus: Secondary | ICD-10-CM | POA: Diagnosis not present

## 2018-01-20 DIAGNOSIS — E785 Hyperlipidemia, unspecified: Secondary | ICD-10-CM | POA: Diagnosis not present

## 2018-01-20 DIAGNOSIS — D869 Sarcoidosis, unspecified: Secondary | ICD-10-CM | POA: Diagnosis not present

## 2018-01-20 DIAGNOSIS — Z8249 Family history of ischemic heart disease and other diseases of the circulatory system: Secondary | ICD-10-CM | POA: Diagnosis not present

## 2018-01-20 DIAGNOSIS — Z801 Family history of malignant neoplasm of trachea, bronchus and lung: Secondary | ICD-10-CM | POA: Diagnosis not present

## 2018-01-20 DIAGNOSIS — M7061 Trochanteric bursitis, right hip: Secondary | ICD-10-CM

## 2018-01-20 DIAGNOSIS — Z8051 Family history of malignant neoplasm of kidney: Secondary | ICD-10-CM | POA: Diagnosis not present

## 2018-01-20 DIAGNOSIS — M7062 Trochanteric bursitis, left hip: Secondary | ICD-10-CM | POA: Diagnosis not present

## 2018-01-20 DIAGNOSIS — E669 Obesity, unspecified: Secondary | ICD-10-CM

## 2018-01-20 DIAGNOSIS — I10 Essential (primary) hypertension: Secondary | ICD-10-CM | POA: Insufficient documentation

## 2018-01-20 DIAGNOSIS — Z87891 Personal history of nicotine dependence: Secondary | ICD-10-CM | POA: Insufficient documentation

## 2018-01-20 DIAGNOSIS — Z823 Family history of stroke: Secondary | ICD-10-CM | POA: Diagnosis not present

## 2018-01-20 DIAGNOSIS — Z808 Family history of malignant neoplasm of other organs or systems: Secondary | ICD-10-CM | POA: Diagnosis not present

## 2018-01-20 DIAGNOSIS — I69351 Hemiplegia and hemiparesis following cerebral infarction affecting right dominant side: Secondary | ICD-10-CM | POA: Diagnosis not present

## 2018-01-20 NOTE — Progress Notes (Signed)
Subjective:    Patient ID: Sharon Roberson, female    DOB: 1965-12-19, 52 y.o.   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   Last clinic visit 11/25/16. At that time, pt had b/l trochanteric bursa injections.  Pt is upset that she has not received her TENS unit.  She states she did well with the injection.  Pt states she was told by Pharmacy she was allergic to Diclofenac, however, nothing on file.  She had good benefit with OTC lidoderm patches. She states she never received a call from dietitian.    Pain Inventory Average Pain 3 Pain Right Now 3 My pain is sharp and stabbing  In the last 24 hours, has pain interfered with the following? General activity 0 Relation with others 0 Enjoyment of life 0 What TIME of day is your pain at its worst? morning, evening Sleep (in general) Fair  Pain is worse with: walking, sitting and standing Pain improves with: rest, heat/ice, TENS and injections Relief from Meds: 3  Mobility walk without assistance Do you have any goals in this area?  no  Function disabled: date disabled . Do you have any goals in this area?  no  Neuro/Psych depression  Prior Studies Any changes since last visit?  no  Physicians involved in your care Any changes since last visit?  no   Family History  Problem Relation Age of Onset  . Kidney cancer Mother        died 80yr  (had kidney transplant)  . Lung cancer Mother   . Stroke Father        died 62y  Gallstones  . Hypertension Sister   . Thyroid disease Sister   . Bone cancer Brother        stem cell transplant  . Diabetes Brother   . Hypertension Brother   . Heart failure Sister   . Hypertension Sister    Social History   Socioeconomic History  . Marital status: Significant Other    Spouse name: Not on file  .  Number of children: 1  . Years of education: college  . Highest education level: Not on file  Occupational History  . Occupation: Disability   Social Needs  . Financial resource strain: Not on file  . Food insecurity:    Worry: Not on file    Inability: Not on file  . Transportation needs:    Medical: Not on file    Non-medical: Not on file  Tobacco Use  . Smoking status: Former Smoker    Packs/day: 0.50    Types: Cigarettes    Last attempt to quit: 04/23/2016    Years since quitting: 1.7  . Smokeless tobacco: Never Used  Substance and Sexual Activity  . Alcohol use: Yes    Comment: occasional  . Drug use: Yes    Types: Marijuana    Comment: marijuana: occasional  . Sexual activity: Not on file  Lifestyle  . Physical activity:    Days per week: Not on file    Minutes per session: Not on file  . Stress: Not on file  Relationships  . Social connections:    Talks on phone: Not on file    Gets together: Not on file    Attends religious service: Not on file    Active member of club or organization:  Not on file    Attends meetings of clubs or organizations: Not on file    Relationship status: Not on file  Other Topics Concern  . Not on file  Social History Narrative   Right handed.   Caffeine some (soda every now and then)   Living home. One child (52 yr)   Disabled.     Past Surgical History:  Procedure Laterality Date  . KIDNEY STONE SURGERY     1999,2000,2001,2002,2003,2004  . LUNG SURGERY  2003  . THYROID SURGERY     Past Medical History:  Diagnosis Date  . Eyelid cyst, right 07/17/2016  . Hypercholesteremia   . Hypertension   . Pneumothorax   . Sarcoidosis   . Stroke (HCC)    BP 129/88 (BP Location: Right Arm, Patient Position: Sitting, Cuff Size: Normal)   Pulse 75   Resp 14   Ht 5' (1.524 m)   Wt 181 lb (82.1 kg)   SpO2 98%   BMI 35.35 kg/m   Opioid Risk Score:   Fall Risk Score:  `1  Depression screen PHQ 2/9  No flowsheet data  found.  Review of Systems  Constitutional: Negative.   HENT: Negative.   Eyes: Negative.   Respiratory: Negative.   Cardiovascular: Negative.   Gastrointestinal: Negative.   Endocrine: Negative.        High blood sugar  Genitourinary: Negative.   Musculoskeletal: Positive for arthralgias, back pain, gait problem and joint swelling.  Skin: Negative.   Allergic/Immunologic: Negative.   Neurological:       Spasms  Hematological: Negative.   Psychiatric/Behavioral: Positive for dysphoric mood.  All other systems reviewed and are negative.     Objective:   Physical Exam Gen NAD. Vital signs reviewed. HEENT: normocephalic, atraumatic Cardio: RRR. No JVD. Resp: CTA B/L and Unlabored GI: BS+and ND Musc/Skel:  No edema.   +TTP left > right greater trochanter. Neuro: Alert and Oriented Motor:  B/l E: 5/5 proximal to distal  Skin:  Intact. Warm and dry. Psych: Normal mood. Normal affect.    Assessment & Plan:  52 year old right-handed female, history of hypertension, hyperlipidemia, tobacco abuse presents for follow up for left greater trochanteric bursitis with hx of CVA.   1. 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  Cont HEP  Pt released from Neurology             Cont meds  2.Chronic Pain syndrome, greatest at left >right  greater trochanter:              D/ced Gabapentin 100 TID due to nausea             D/ced Mobic due lack of efficacy  Good benefit with PT  Good benefit with steroid injection 3/13  Cont Voltaren gel  Will order Diclofenac 75 BID and consider d/cing Voltaren gel, encouraged trial of diclofenac - pt states she was told my pharmacy she was allergic  Will order TENs as pt had good benefits with PT, will provide script and encouraged pt to follow up with insruance  Cont OTC Lidoderm patch             Will consider Lyrica in future   3. Obesity  Dietitian referral (encouraged pt follow up, 6th time)- stating she  has never received call back             Encouraged activity and weight loss.  Discouraged use of "diet pill" again  4. Fibromyalgia             Cont Cymbalta             Pt appears stable at present

## 2018-03-10 ENCOUNTER — Ambulatory Visit: Payer: Medicare Other | Admitting: Physical Medicine & Rehabilitation

## 2018-03-17 ENCOUNTER — Ambulatory Visit: Payer: Medicare Other | Admitting: Physical Medicine & Rehabilitation

## 2018-03-19 ENCOUNTER — Other Ambulatory Visit: Payer: Self-pay

## 2018-03-19 ENCOUNTER — Encounter: Payer: Medicare Other | Attending: Physical Medicine & Rehabilitation | Admitting: Physical Medicine & Rehabilitation

## 2018-03-19 ENCOUNTER — Encounter: Payer: Self-pay | Admitting: Physical Medicine & Rehabilitation

## 2018-03-19 VITALS — BP 131/89 | HR 83 | Ht 59.0 in | Wt 181.6 lb

## 2018-03-19 DIAGNOSIS — Z808 Family history of malignant neoplasm of other organs or systems: Secondary | ICD-10-CM | POA: Insufficient documentation

## 2018-03-19 DIAGNOSIS — Z801 Family history of malignant neoplasm of trachea, bronchus and lung: Secondary | ICD-10-CM | POA: Diagnosis not present

## 2018-03-19 DIAGNOSIS — Z87891 Personal history of nicotine dependence: Secondary | ICD-10-CM | POA: Insufficient documentation

## 2018-03-19 DIAGNOSIS — M7061 Trochanteric bursitis, right hip: Secondary | ICD-10-CM

## 2018-03-19 DIAGNOSIS — I6930 Unspecified sequelae of cerebral infarction: Secondary | ICD-10-CM | POA: Diagnosis not present

## 2018-03-19 DIAGNOSIS — Z823 Family history of stroke: Secondary | ICD-10-CM | POA: Diagnosis not present

## 2018-03-19 DIAGNOSIS — I69351 Hemiplegia and hemiparesis following cerebral infarction affecting right dominant side: Secondary | ICD-10-CM | POA: Diagnosis not present

## 2018-03-19 DIAGNOSIS — Z8349 Family history of other endocrine, nutritional and metabolic diseases: Secondary | ICD-10-CM | POA: Diagnosis not present

## 2018-03-19 DIAGNOSIS — Z8051 Family history of malignant neoplasm of kidney: Secondary | ICD-10-CM | POA: Insufficient documentation

## 2018-03-19 DIAGNOSIS — Z8249 Family history of ischemic heart disease and other diseases of the circulatory system: Secondary | ICD-10-CM | POA: Insufficient documentation

## 2018-03-19 DIAGNOSIS — I10 Essential (primary) hypertension: Secondary | ICD-10-CM | POA: Diagnosis not present

## 2018-03-19 DIAGNOSIS — M797 Fibromyalgia: Secondary | ICD-10-CM | POA: Diagnosis not present

## 2018-03-19 DIAGNOSIS — D869 Sarcoidosis, unspecified: Secondary | ICD-10-CM | POA: Insufficient documentation

## 2018-03-19 DIAGNOSIS — E669 Obesity, unspecified: Secondary | ICD-10-CM | POA: Insufficient documentation

## 2018-03-19 DIAGNOSIS — M7062 Trochanteric bursitis, left hip: Secondary | ICD-10-CM

## 2018-03-19 DIAGNOSIS — Z79899 Other long term (current) drug therapy: Secondary | ICD-10-CM | POA: Diagnosis not present

## 2018-03-19 DIAGNOSIS — G894 Chronic pain syndrome: Secondary | ICD-10-CM | POA: Insufficient documentation

## 2018-03-19 DIAGNOSIS — E785 Hyperlipidemia, unspecified: Secondary | ICD-10-CM | POA: Insufficient documentation

## 2018-03-19 DIAGNOSIS — Z833 Family history of diabetes mellitus: Secondary | ICD-10-CM | POA: Insufficient documentation

## 2018-03-19 MED ORDER — KETOROLAC TROMETHAMINE 60 MG/2ML IM SOLN
60.0000 mg | Freq: Once | INTRAMUSCULAR | Status: AC
  Administered 2018-03-19: 60 mg via INTRAMUSCULAR

## 2018-03-19 MED ORDER — KETOROLAC TROMETHAMINE 60 MG/2ML IM SOLN: 60.0000 mg | Freq: Once | INTRAMUSCULAR | Status: DC

## 2018-03-19 NOTE — Progress Notes (Signed)
Subjective:    Patient ID: Sharon Roberson, female    DOB: 10/11/1965, 52 y.o.   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   Last clinic visit 01/20/18. Since that time, pt states she has had a sinus infection.  She is doing HEP. She never picked up Diclofenac, stating she does not want to take more medications.  She does say she is in significant pain, all over.  Her PCP is going to change her cholesterol medicine.  She has not picked up TENS unit.  She has not followed up with dietitian yet.  Pain Inventory Average Pain 8  Pain Right Now 8 My pain is sharp and stabbing  In the last 24 hours, has pain interfered with the following? General activity 5 Relation with others 8 Enjoyment of life 10 What TIME of day is your pain at its worst? morning, evening Sleep (in general) Fair  Pain is worse with: walking, sitting and standing Pain improves with: rest, heat/ice, TENS and injections Relief from Meds: 3  Mobility walk without assistance Do you have any goals in this area?  no  Function disabled: date disabled . Do you have any goals in this area?  no  Neuro/Psych depression  Prior Studies Any changes since last visit?  no  Physicians involved in your care Any changes since last visit?  no   Family History  Problem Relation Age of Onset  . Kidney cancer Mother        died 55yr  (had kidney transplant)  . Lung cancer Mother   . Stroke Father        died 62y  Gallstones  . Hypertension Sister   . Thyroid disease Sister   . Bone cancer Brother        stem cell transplant  . Diabetes Brother   . Hypertension Brother   . Heart failure Sister   . Hypertension Sister    Social History   Socioeconomic History  . Marital status: Significant Other    Spouse name: Not on file  .  Number of children: 1  . Years of education: college  . Highest education level: Not on file  Occupational History  . Occupation: Disability   Social Needs  . Financial resource strain: Not on file  . Food insecurity:    Worry: Not on file    Inability: Not on file  . Transportation needs:    Medical: Not on file    Non-medical: Not on file  Tobacco Use  . Smoking status: Former Smoker    Packs/day: 0.50    Types: Cigarettes    Last attempt to quit: 04/23/2016    Years since quitting: 1.9  . Smokeless tobacco: Never Used  Substance and Sexual Activity  . Alcohol use: Yes    Comment: occasional  . Drug use: Yes    Types: Marijuana    Comment: marijuana: occasional  . Sexual activity: Not on file  Lifestyle  . Physical activity:    Days per week: Not on file    Minutes per session: Not on file  . Stress: Not on file  Relationships  . Social connections:    Talks on phone: Not on file    Gets together: Not on file    Attends religious service: Not on file    Active member of club  or organization: Not on file    Attends meetings of clubs or organizations: Not on file    Relationship status: Not on file  Other Topics Concern  . Not on file  Social History Narrative   Right handed.   Caffeine some (soda every now and then)   Living home. One child (27 yr)   Disabled.     Past Surgical History:  Procedure Laterality Date  . KIDNEY STONE SURGERY     1999,2000,2001,2002,2003,2004  . LUNG SURGERY  2003  . THYROID SURGERY     Past Medical History:  Diagnosis Date  . Eyelid cyst, right 07/17/2016  . Hypercholesteremia   . Hypertension   . Pneumothorax   . Sarcoidosis   . Stroke (HCC)    BP 131/89   Pulse 83   Ht 4\' 11"  (1.499 m)   Wt 181 lb 9.6 oz (82.4 kg)   BMI 36.68 kg/m   Opioid Risk Score:   Fall Risk Score:  `1  Depression screen PHQ 2/9  Depression screen PHQ 2/9 03/19/2018  Decreased Interest 1  Down, Depressed, Hopeless 1  PHQ - 2 Score 2     Review of Systems  Constitutional: Negative.   HENT: Negative.   Eyes: Negative.   Respiratory: Negative.   Cardiovascular: Negative.   Gastrointestinal: Negative.   Endocrine: Negative.        High blood sugar  Genitourinary: Negative.   Musculoskeletal: Positive for arthralgias, back pain, gait problem and joint swelling.  Skin: Negative.   Allergic/Immunologic: Negative.   Neurological:       Spasms  Hematological: Negative.   Psychiatric/Behavioral: Positive for dysphoric mood.  All other systems reviewed and are negative.     Objective:   Physical Exam Gen NAD. Vital signs reviewed. HEENT: normocephalic, atraumatic Cardio: RRR. No JVD. Resp: CTA B/L and Unlabored GI: BS+and ND Musc/Skel:  No edema.   +TTP diffusely in b/l LE Neuro: Alert and Oriented Motor:  B/l E: 5/5 proximal to distal  Skin:  Intact. Warm and dry. Psych: Normal mood. Normal affect.    Assessment & Plan:  52 year old right-handed female, history of hypertension, hyperlipidemia, tobacco abuse presents for follow up for left greater trochanteric bursitis with hx of CVA.   1. 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  Cont HEP             Cont meds  2.Chronic Pain syndrome, greatest at left >right  greater trochanter:              D/ced Gabapentin 100 TID due to nausea             D/ced Mobic due lack of efficacy  Good benefit with PT  Good benefit with steroid injection 3/13, will schedule for repeat injections  Cont Voltaren gel  Ordered Diclofenac 75 BID and consider d/cing Voltaren gel, encouraged trial of diclofenac - initially stated she was told my pharmacy she was allergic, now states she does not want to take medication  Ordered TENs as pt had good benefits with PT, provided script and encouraged pt to follow up with insruance, pt never followed up, now states she will order online  Cont OTC Lidoderm patch             Will consider Lyrica in  future  Will order Toradol injection today   3. Obesity  Dietitian referral (encouraged pt follow up, 7th time)- stating she has never received call back to  this day             Encouraged activity and weight loss.             Discouraged use of "diet pill" again  4. Fibromyalgia             Cont Cymbalta             Pt appears stable at present

## 2018-04-16 ENCOUNTER — Encounter: Payer: Medicare Other | Attending: Physical Medicine & Rehabilitation | Admitting: Physical Medicine & Rehabilitation

## 2018-04-16 ENCOUNTER — Encounter: Payer: Medicare Other | Admitting: Physical Medicine & Rehabilitation

## 2018-04-16 ENCOUNTER — Encounter: Payer: Self-pay | Admitting: Physical Medicine & Rehabilitation

## 2018-04-16 VITALS — BP 128/87 | HR 81 | Resp 14 | Ht 59.0 in | Wt 178.0 lb

## 2018-04-16 DIAGNOSIS — Z833 Family history of diabetes mellitus: Secondary | ICD-10-CM | POA: Diagnosis not present

## 2018-04-16 DIAGNOSIS — G894 Chronic pain syndrome: Secondary | ICD-10-CM | POA: Diagnosis not present

## 2018-04-16 DIAGNOSIS — Z87891 Personal history of nicotine dependence: Secondary | ICD-10-CM | POA: Insufficient documentation

## 2018-04-16 DIAGNOSIS — Z79899 Other long term (current) drug therapy: Secondary | ICD-10-CM | POA: Insufficient documentation

## 2018-04-16 DIAGNOSIS — E669 Obesity, unspecified: Secondary | ICD-10-CM | POA: Diagnosis not present

## 2018-04-16 DIAGNOSIS — Z823 Family history of stroke: Secondary | ICD-10-CM | POA: Diagnosis not present

## 2018-04-16 DIAGNOSIS — Z8051 Family history of malignant neoplasm of kidney: Secondary | ICD-10-CM | POA: Insufficient documentation

## 2018-04-16 DIAGNOSIS — Z808 Family history of malignant neoplasm of other organs or systems: Secondary | ICD-10-CM | POA: Diagnosis not present

## 2018-04-16 DIAGNOSIS — D869 Sarcoidosis, unspecified: Secondary | ICD-10-CM | POA: Diagnosis not present

## 2018-04-16 DIAGNOSIS — Z801 Family history of malignant neoplasm of trachea, bronchus and lung: Secondary | ICD-10-CM | POA: Insufficient documentation

## 2018-04-16 DIAGNOSIS — Z8349 Family history of other endocrine, nutritional and metabolic diseases: Secondary | ICD-10-CM | POA: Diagnosis not present

## 2018-04-16 DIAGNOSIS — I10 Essential (primary) hypertension: Secondary | ICD-10-CM | POA: Diagnosis not present

## 2018-04-16 DIAGNOSIS — M7062 Trochanteric bursitis, left hip: Secondary | ICD-10-CM

## 2018-04-16 DIAGNOSIS — E785 Hyperlipidemia, unspecified: Secondary | ICD-10-CM | POA: Diagnosis not present

## 2018-04-16 DIAGNOSIS — I69351 Hemiplegia and hemiparesis following cerebral infarction affecting right dominant side: Secondary | ICD-10-CM | POA: Diagnosis not present

## 2018-04-16 DIAGNOSIS — M797 Fibromyalgia: Secondary | ICD-10-CM | POA: Diagnosis not present

## 2018-04-16 DIAGNOSIS — Z8249 Family history of ischemic heart disease and other diseases of the circulatory system: Secondary | ICD-10-CM | POA: Diagnosis not present

## 2018-04-16 DIAGNOSIS — M7061 Trochanteric bursitis, right hip: Secondary | ICD-10-CM | POA: Diagnosis not present

## 2018-04-16 NOTE — Progress Notes (Signed)
B/l greater trochanteric bursa injection  Indication: Trochanteric bursa syndrome not relieved by medication management and other conservative care.  Informed consent was obtained after describing risks and benefits of the procedure with the patient, this includes bleeding, bruising, infection and medication side effects. The patient wishes to proceed and has given written consent. Patient was placed in a seated position. The b/l greater trochanteric area was marked and prepped with betadine at the point of maximal tenderness. Vapocoolant spray applied. A 21-gauge 2 inch needle was inserted into the greater trochanteric area until bone was felt.  Needle was drawn back and after negative draw back for blood, a 4cc solution containing 1mL of 6 mg per ML celestone and 3.0 mL of 1% lidocaine was injected (x2). A band aid was applied. The patient tolerated the procedure well. Post procedure instructions were given.

## 2018-05-07 ENCOUNTER — Encounter: Payer: Medicare Other | Admitting: Physical Medicine & Rehabilitation

## 2018-05-12 ENCOUNTER — Ambulatory Visit: Payer: Medicare Other | Admitting: Physical Medicine & Rehabilitation

## 2018-05-27 ENCOUNTER — Ambulatory Visit: Payer: Medicare Other | Admitting: Physical Medicine & Rehabilitation
